# Patient Record
Sex: Male | Born: 1955 | Race: Black or African American | Hispanic: No | Marital: Married | Smoking: Former smoker
Health system: Southern US, Community
[De-identification: ages and names within clinical notes are randomized; demographics above are authoritative.]

## PROBLEM LIST (undated history)

## (undated) DIAGNOSIS — E785 Hyperlipidemia, unspecified: Secondary | ICD-10-CM

## (undated) DIAGNOSIS — I639 Cerebral infarction, unspecified: Secondary | ICD-10-CM

## (undated) DIAGNOSIS — I2510 Atherosclerotic heart disease of native coronary artery without angina pectoris: Secondary | ICD-10-CM

## (undated) DIAGNOSIS — I252 Old myocardial infarction: Secondary | ICD-10-CM

## (undated) HISTORY — DX: Hyperlipidemia, unspecified: E78.5

## (undated) HISTORY — DX: Atherosclerotic heart disease of native coronary artery without angina pectoris: I25.10

## (undated) HISTORY — DX: Cerebral infarction, unspecified: I63.9

## (undated) HISTORY — DX: Old myocardial infarction: I25.2

---

## 2006-06-25 ENCOUNTER — Encounter: Admission: RE | Admit: 2006-06-25 | Discharge: 2006-06-25 | Payer: Self-pay | Admitting: Neurosurgery

## 2017-07-28 ENCOUNTER — Encounter (HOSPITAL_BASED_OUTPATIENT_CLINIC_OR_DEPARTMENT_OTHER): Payer: Self-pay | Admitting: Emergency Medicine

## 2017-07-28 ENCOUNTER — Emergency Department (HOSPITAL_BASED_OUTPATIENT_CLINIC_OR_DEPARTMENT_OTHER)
Admission: EM | Admit: 2017-07-28 | Discharge: 2017-07-28 | Disposition: A | Discharge status: Home / Self Care | Payer: BLUE CROSS/BLUE SHIELD | Attending: Emergency Medicine | Admitting: Emergency Medicine

## 2017-07-28 DIAGNOSIS — R21 Rash and other nonspecific skin eruption: Secondary | ICD-10-CM | POA: Insufficient documentation

## 2017-07-28 DIAGNOSIS — F17200 Nicotine dependence, unspecified, uncomplicated: Secondary | ICD-10-CM | POA: Insufficient documentation

## 2017-07-28 MED ORDER — PREDNISONE 20 MG PO TABS: ORAL_TABLET | ORAL | 0 refills | Status: DC

## 2017-07-28 NOTE — ED Triage Notes (Signed)
Hives type rash since Wed, no SOB, ? laundry detergent reaction .

## 2017-07-28 NOTE — ED Provider Notes (Signed)
[facility] Provider Note   CSN: [id number] Arrival date & time: 07/28/17  0746     History   Chief Complaint Chief Complaint  Patient presents with  . Allergic Reaction    HPI Peter Dominguez is a 61 y.o. male.Complains of pruritic rash on arms and on face onset 3 days ago and nothing makes symptoms better or worse no shortness of breath no fever no pain no. He treated rash with putting alcohol on a without relief no other associated symptoms. His wife has the same symptoms, which started the same day  HPI  History reviewed. No pertinent past medical history.  There are no active problems to display for this patient.   History reviewed. No pertinent surgical history.     Home Medications    Prior to Admission medications   Medication Sig Start Date End Date Taking? Authorizing Provider  predniSONE (DELTASONE) 20 MG tablet 2 tabs po daily x 4 days 07/28/17   Doug Sou, MD    Family History No family history on file.  Social History Social History  Substance Use Topics  . Smoking status: Never Smoker  . Smokeless tobacco: Never Used  . Alcohol use Yes     Comment: social    Positive smoker occasional alcohol no illicit drug use  Allergies   Patient has no known allergies.   Review of Systems Review of Systems  Constitutional: Negative.   HENT: Negative.   Respiratory: Negative.   Cardiovascular: Negative.   Gastrointestinal: Negative.   Musculoskeletal: Negative.   Skin: Positive for rash.  Neurological: Negative.   Psychiatric/Behavioral: Negative.   All other systems reviewed and are negative.    Physical Exam Updated Vital Signs BP 129/83 (BP Location: Left Arm)   Pulse 75   Temp 98.5 F (36.9 C) (Oral)   Resp 18   Ht  (1.778 m)   Wt 86.2 kg (190 lb)   SpO2 99%   BMI 27.26 kg/m   Physical Exam  Constitutional: He appears well-developed and well-nourished. No distress.  HENT:  Head: Normocephalic and  atraumatic.  No mucosal lesions  Eyes: Pupils are equal, round, and reactive to light. Conjunctivae are normal.  Neck: Neck supple. No tracheal deviation present. No thyromegaly present.  Cardiovascular: Normal rate and regular rhythm.   No murmur heard. Pulmonary/Chest: Effort normal and breath sounds normal.  Abdominal: Soft. Bowel sounds are normal. He exhibits no distension. There is no tenderness.  Musculoskeletal: Normal range of motion. He exhibits no edema or tenderness.  Neurological: He is alert. Coordination normal.  Skin: Skin is warm and dry. Rash noted.  There are a few 1-2 mm scabbed lesions on forearms otherwise without rash  Psychiatric: He has a normal mood and affect.  Nursing note and vitals reviewed.    ED Treatments / Results  Labs (all labs ordered are listed, but only abnormal results are displayed) Labs Reviewed - No data to display  EKG  EKG Interpretation None       Radiology No results found.  Procedures Procedures (including critical care time)  Medications Ordered in ED Medications - No data to display   Initial Impression / Assessment and Plan / ED Course  I have reviewed the triage vital signs and the nursing notes.  Pertinent labs & imaging results that were available during my care of the patient were reviewed by me and considered in my medical decision making (see chart for details).     Plan prescription prednisone,  Benadryl 50 Mill grams 4 times daily as needed for itch, dermatology referral Dr. Terri Piedra as needed or he can see his primary care physician if not better by next week Rash is felt to be nonspecific Final Clinical Impressions(s) / ED Diagnoses   Final diagnoses:  Rash    New Prescriptions New Prescriptions   PREDNISONE (DELTASONE) 20 MG TABLET    2 tabs po daily x 4 days     Doug Sou, MD 07/28/17 0830

## 2017-07-28 NOTE — Discharge Instructions (Signed)
Take Benadryl 50 mg 4 times daily as needed for itch, as well as the medication prescribed contact your primary care physician or call Dr. Terri Piedra to schedule an appointment if not feeling better by next week

## 2019-05-08 ENCOUNTER — Observation Stay (HOSPITAL_COMMUNITY): Payer: BC Managed Care – PPO

## 2019-05-08 ENCOUNTER — Emergency Department (HOSPITAL_BASED_OUTPATIENT_CLINIC_OR_DEPARTMENT_OTHER): Payer: BC Managed Care – PPO

## 2019-05-08 ENCOUNTER — Observation Stay (HOSPITAL_BASED_OUTPATIENT_CLINIC_OR_DEPARTMENT_OTHER)
Admission: EM | Admit: 2019-05-08 | Discharge: 2019-05-09 | Disposition: A | Discharge status: Home / Self Care | Payer: BC Managed Care – PPO | Attending: Family Medicine | Admitting: Family Medicine

## 2019-05-08 ENCOUNTER — Other Ambulatory Visit: Payer: Self-pay

## 2019-05-08 ENCOUNTER — Encounter (HOSPITAL_BASED_OUTPATIENT_CLINIC_OR_DEPARTMENT_OTHER): Payer: Self-pay

## 2019-05-08 DIAGNOSIS — Z7982 Long term (current) use of aspirin: Secondary | ICD-10-CM | POA: Insufficient documentation

## 2019-05-08 DIAGNOSIS — Z79899 Other long term (current) drug therapy: Secondary | ICD-10-CM | POA: Insufficient documentation

## 2019-05-08 DIAGNOSIS — E785 Hyperlipidemia, unspecified: Secondary | ICD-10-CM | POA: Diagnosis not present

## 2019-05-08 DIAGNOSIS — I700 Atherosclerosis of aorta: Secondary | ICD-10-CM | POA: Insufficient documentation

## 2019-05-08 DIAGNOSIS — Z1159 Encounter for screening for other viral diseases: Secondary | ICD-10-CM | POA: Diagnosis not present

## 2019-05-08 DIAGNOSIS — I639 Cerebral infarction, unspecified: Principal | ICD-10-CM | POA: Diagnosis present

## 2019-05-08 DIAGNOSIS — R590 Localized enlarged lymph nodes: Secondary | ICD-10-CM

## 2019-05-08 DIAGNOSIS — Z7902 Long term (current) use of antithrombotics/antiplatelets: Secondary | ICD-10-CM | POA: Insufficient documentation

## 2019-05-08 DIAGNOSIS — R200 Anesthesia of skin: Secondary | ICD-10-CM

## 2019-05-08 LAB — URINALYSIS, ROUTINE W REFLEX MICROSCOPIC
Bilirubin Urine: NEGATIVE
Glucose, UA: NEGATIVE mg/dL
Ketones, ur: 15 mg/dL — AB
Leukocytes,Ua: NEGATIVE
Nitrite: NEGATIVE
Protein, ur: NEGATIVE mg/dL
Specific Gravity, Urine: >1.03 — ABNORMAL HIGH (ref 1.005–1.030)
pH: 5.5 (ref 5.0–8.0)

## 2019-05-08 LAB — CREATININE, SERUM
Creatinine, Ser: 0.85 mg/dL (ref 0.61–1.24)
GFR calc Af Amer: >60 mL/min (ref >60)
GFR calc non Af Amer: >60 mL/min (ref >60)

## 2019-05-08 LAB — CBC
HCT: 45.9 % (ref 39.0–52.0)
HCT: 42.4 % (ref 39.0–52.0)
Hemoglobin: 15.5 g/dL (ref 13.0–17.0)
Hemoglobin: 14.8 g/dL (ref 13.0–17.0)
MCH: 35.4 pg — ABNORMAL HIGH (ref 26.0–34.0)
MCH: 36 pg — ABNORMAL HIGH (ref 26.0–34.0)
MCHC: 33.8 g/dL (ref 30.0–36.0)
MCHC: 34.9 g/dL (ref 30.0–36.0)
MCV: 104.8 fL — ABNORMAL HIGH (ref 80.0–100.0)
MCV: 103.2 fL — ABNORMAL HIGH (ref 80.0–100.0)
Platelets: 213 10*3/uL (ref 150–400)
Platelets: 210 10*3/uL (ref 150–400)
RBC: 4.38 MIL/uL (ref 4.22–5.81)
RBC: 4.11 MIL/uL — ABNORMAL LOW (ref 4.22–5.81)
RDW: 12.6 % (ref 11.5–15.5)
RDW: 12.9 % (ref 11.5–15.5)
WBC: 5 10*3/uL (ref 4.0–10.5)
WBC: 4.2 10*3/uL (ref 4.0–10.5)
nRBC: 0 % (ref 0.0–0.2)
nRBC: 0 % (ref 0.0–0.2)

## 2019-05-08 LAB — COMPREHENSIVE METABOLIC PANEL
ALT: 22 U/L (ref 0–44)
AST: 29 U/L (ref 15–41)
Albumin: 3.8 g/dL (ref 3.5–5.0)
Alkaline Phosphatase: 88 U/L (ref 38–126)
Anion gap: 10 (ref 5–15)
BUN: 9 mg/dL (ref 8–23)
CO2: 20 mmol/L — ABNORMAL LOW (ref 22–32)
Calcium: 9.3 mg/dL (ref 8.9–10.3)
Chloride: 107 mmol/L (ref 98–111)
Creatinine, Ser: 0.82 mg/dL (ref 0.61–1.24)
GFR calc Af Amer: >60 mL/min (ref >60)
GFR calc non Af Amer: >60 mL/min (ref >60)
Glucose, Bld: 96 mg/dL (ref 70–99)
Potassium: 4.2 mmol/L (ref 3.5–5.1)
Sodium: 137 mmol/L (ref 135–145)
Total Bilirubin: 0.9 mg/dL (ref 0.3–1.2)
Total Protein: 7.1 g/dL (ref 6.5–8.1)

## 2019-05-08 LAB — DIFFERENTIAL
Abs Immature Granulocytes: 0.01 10*3/uL (ref 0.00–0.07)
Basophils Absolute: 0 10*3/uL (ref 0.0–0.1)
Basophils Relative: 0 %
Eosinophils Absolute: 0.1 10*3/uL (ref 0.0–0.5)
Eosinophils Relative: 2 %
Immature Granulocytes: 0 %
Lymphocytes Relative: 30 %
Lymphs Abs: 1.5 10*3/uL (ref 0.7–4.0)
Monocytes Absolute: 0.8 10*3/uL (ref 0.1–1.0)
Monocytes Relative: 16 %
Neutro Abs: 2.6 10*3/uL (ref 1.7–7.7)
Neutrophils Relative %: 52 %

## 2019-05-08 LAB — APTT: aPTT: 29 seconds (ref 24–36)

## 2019-05-08 LAB — LIPID PANEL
Cholesterol: 180 mg/dL (ref 0–200)
HDL: 28 mg/dL — ABNORMAL LOW (ref >40)
LDL Cholesterol: 127 mg/dL — ABNORMAL HIGH (ref 0–99)
Total CHOL/HDL Ratio: 6.4 RATIO
Triglycerides: 126 mg/dL (ref <150)
VLDL: 25 mg/dL (ref 0–40)

## 2019-05-08 LAB — ETHANOL: Alcohol, Ethyl (B): <10 mg/dL (ref <10)

## 2019-05-08 LAB — RAPID URINE DRUG SCREEN, HOSP PERFORMED
Amphetamines: NOT DETECTED
Barbiturates: NOT DETECTED
Benzodiazepines: NOT DETECTED
Cocaine: NOT DETECTED
Opiates: NOT DETECTED
Tetrahydrocannabinol: POSITIVE — AB

## 2019-05-08 LAB — URINALYSIS, MICROSCOPIC (REFLEX)

## 2019-05-08 LAB — PHOSPHORUS: Phosphorus: 2.6 mg/dL (ref 2.5–4.6)

## 2019-05-08 LAB — HEMOGLOBIN A1C
Hgb A1c MFr Bld: 5 % (ref 4.8–5.6)
Mean Plasma Glucose: 96.8 mg/dL

## 2019-05-08 LAB — PROTIME-INR
INR: 1.1 (ref 0.8–1.2)
Prothrombin Time: 13.7 seconds (ref 11.4–15.2)

## 2019-05-08 LAB — SARS CORONAVIRUS 2 BY RT PCR (HOSPITAL ORDER, PERFORMED IN ~~LOC~~ HOSPITAL LAB): SARS Coronavirus 2: NEGATIVE

## 2019-05-08 LAB — MAGNESIUM: Magnesium: 1.9 mg/dL (ref 1.7–2.4)

## 2019-05-08 MED ORDER — ASPIRIN 325 MG PO TABS
325.0000 mg | ORAL_TABLET | Freq: Every day | ORAL | Status: DC
  Administered 2019-05-08 – 2019-05-09 (×2): 325 mg via ORAL
  Filled 2019-05-08: qty 1

## 2019-05-08 MED ORDER — ENOXAPARIN SODIUM 40 MG/0.4ML ~~LOC~~ SOLN
40.0000 mg | SUBCUTANEOUS | Status: DC
  Administered 2019-05-08: 40 mg via SUBCUTANEOUS
  Filled 2019-05-08: qty 0.4

## 2019-05-08 MED ORDER — NICOTINE 21 MG/24HR TD PT24
21.0000 mg | MEDICATED_PATCH | Freq: Every day | TRANSDERMAL | Status: DC
  Administered 2019-05-09 (×2): 21 mg via TRANSDERMAL
  Filled 2019-05-08 (×2): qty 1

## 2019-05-08 NOTE — ED Provider Notes (Signed)
Emergency Department Provider Note   I have reviewed the triage vital signs and the nursing notes.   HISTORY  Chief Complaint Numbness   HPI Peter Dominguez is a 63 y.o. male presents to the emergency department for evaluation of acute onset right arm and leg numbness.  Symptoms began 2 days ago after waking from a nap.  He describes the numbness distribution as from his right hand up to approximately his elbow.  He also noticed numbness in his right leg from approximately his calf into his mid thigh.  He notes a history of sciatica type symptoms but is not experiencing lower back pain.  No neck pain.  No headaches.  He has not experienced any vision changes, difficulty breathing, difficulty swallowing, speech changes.  No word finding difficulties.  No prior history of TIA or stroke.  No radiation of symptoms or other modifying factors.  No past medical history on file.  Patient Active Problem List   Diagnosis Date Noted  . CVA (cerebral vascular accident) ([facility]) 05/08/2019    No past surgical history on file.  Allergies Patient has no known allergies.  No family history on file.  Social History Social History   Tobacco Use  . Smoking status: Never Smoker  . Smokeless tobacco: Never Used  Substance Use Topics  . Alcohol use: Yes    Comment: social   . Drug use: No    Review of Systems  Constitutional: No fever/chills Eyes: No visual changes. ENT: No sore throat. Cardiovascular: Denies chest pain. Respiratory: Denies shortness of breath. Gastrointestinal: No abdominal pain.  No nausea, no vomiting.  No diarrhea.  No constipation. Genitourinary: Negative for dysuria. Musculoskeletal: Negative for back pain. Skin: Negative for rash. Neurological: Negative for headaches, focal weakness. Positive right arm/leg numbness.   10-point ROS otherwise negative.  ____________________________________________   PHYSICAL EXAM:  VITAL SIGNS: Vitals:   05/08/19 1451   BP: (!) 117/93  Pulse: 70  SpO2: 99%   Constitutional: Alert and oriented. Well appearing and in no acute distress. Eyes: Conjunctivae are normal. PERRL. EOMI. Head: Atraumatic. Nose: No congestion/rhinnorhea. Mouth/Throat: Mucous membranes are moist.  Neck: No stridor.  Cardiovascular: Normal rate, regular rhythm. Good peripheral circulation. Grossly normal heart sounds.   Respiratory: Normal respiratory effort.  No retractions. Lungs CTAB. Gastrointestinal: Soft and nontender. No distention.  Musculoskeletal: No lower extremity tenderness nor edema. No gross deformities of extremities. Neurologic:  Normal speech and language. Normal strength in the bilateral upper and lower extremities. Decreased sensation to the right arm from the hand to the elbow and decreased sensation to the RLE to the right calf to the right thigh. Right foot not affected.  Skin:  Skin is warm, dry and intact. No rash noted.   ____________________________________________   LABS (all labs ordered are listed, but only abnormal results are displayed)  Labs Reviewed  CBC - Abnormal; Notable for the following components:      Result Value   MCV 104.8 (*)    MCH 35.4 (*)    All other components within normal limits  SARS CORONAVIRUS 2 (HOSPITAL ORDER, [place] [facility])  ETHANOL  DIFFERENTIAL  RAPID URINE DRUG SCREEN, HOSP PERFORMED  URINALYSIS, ROUTINE W REFLEX MICROSCOPIC  COMPREHENSIVE METABOLIC PANEL  PROTIME-INR  APTT   ____________________________________________  EKG   EKG Interpretation  Date/Time:  Thursday May 08 2019 15:01:26 EDT Ventricular Rate:  72 PR Interval:    QRS Duration: 91 QT Interval:  377 QTC Calculation: 413  R Axis:   72 Text Interpretation:  Sinus rhythm No STEMI  Confirmed by Alona BeneLong, Joshua [phone]([id number]) on 05/08/2019 3:08:38 PM       ____________________________________________  RADIOLOGY  Ct Head Wo Contrast  Result Date: 05/08/2019 CLINICAL  DATA:  Right hand numbness EXAM: CT HEAD WITHOUT CONTRAST TECHNIQUE: Contiguous axial images were obtained from the base of the skull through the vertex without intravenous contrast. COMPARISON:  None. FINDINGS: Brain: Small focal well-circumscribed low-density in the region of the left thalamus, likely remote lacunar infarct. No evidence of acute infarction, hemorrhage, hydrocephalus, extra-axial collection or mass lesion/mass effect. Vascular: No hyperdense vessel or unexpected calcification. Skull: Normal. Negative for fracture or focal lesion. Sinuses/Orbits: No acute finding. Other: None. IMPRESSION: No acute intracranial findings. Electronically Signed   By: Duanne GuessNicholas  Plundo M.D.   On: 05/08/2019 15:51    ____________________________________________   PROCEDURES  Procedure(s) performed:   Procedures  None  ____________________________________________   INITIAL IMPRESSION / ASSESSMENT AND PLAN / ED COURSE  Pertinent labs & imaging results that were available during my care of the patient were reviewed by me and considered in my medical decision making (see chart for details).   Patient presents to the emergency department for evaluation of right arm and leg weakness.  I do have concern for stroke.  Patient also with history of sciatica but should not explain right upper extremity symptoms.  Patient without neck pain or known history of cervical spine stenosis.  Plan for stroke labs, CT head without contrast, tele-neurology evaluation. VAN negative. Outside of window for intervention.   04:35 PM  CT imaging reviewed along with blood work.  Spoke with tele-neurology who evaluated the patient.  They recommend admission for work-up of possible thalamic stroke.  Discussed with the hospitalist.   Discussed patient's case with Hospitalist to request admission. Patient and family (if present) updated with plan. Care transferred to Hospitalist service.  I reviewed all nursing notes, vitals,  pertinent old records, EKGs, labs, imaging (as available).  ____________________________________________  FINAL CLINICAL IMPRESSION(S) / ED DIAGNOSES  Final diagnoses:  Right arm numbness  Right leg numbness     Note:  This document was prepared using Dragon voice recognition software and may include unintentional dictation errors.  Alona BeneJoshua Long, MD Emergency Medicine    Long, Arlyss RepressJoshua G, MD 05/08/19 1640

## 2019-05-08 NOTE — H&P (Signed)
History and Physical  Peter Dominguez IRC:[id number] DOB: March 27, 1956 DOA: 05/08/2019  Referring physician: ER provider PCP: Crista Elliot, PA-C  Outpatient Specialists:    Patient coming from: [place] emergency room  Chief Complaint: Right-sided numbness for 2 days.  HPI: Patient is a 63 year old African-American male with no significant past medical history.  Presented with 2-day history of numbness involving the right upper and lower extremity, without any motor deficit.  Numbness has persisted since onset.  Patient called the primary care provider and patient was advised to come to the hospital.  CT head done in the emergency room was negative acute infarct.  Patient was seen by telemetry neurology team and there were concerns for possible left thalamic infarct, hence, the decision to transfer patient to [facility].  No headache, no neck pain, no chest pain, no nausea or vomiting, no URI symptoms, no shortness of breath, no urinary symptoms.  Patient will be admitted for further assessment and management.  MRI of the brain is planned.  ED Course: Temperature is 98.6, blood pressure 120/84, heart rate of 80, respiratory rate of 24 and O2 sat of 100%.  CT scan is nonrevealing.  Telemetry neurology has consulted and patient and advised stroke work-up, antiplatelet (aspirin 325 mg p.o. once daily) for hospitalist team to admit patient.   Pertinent labs: Chemistry reveals sodium of 137, potassium of 4.2, chloride 107, CO2 20, BUN of 9, serum creatinine 0.82 with blood sugar of 96.  CBC reveals WBC of 5, hemoglobin 15.5, hematocrit of 45.9, MCV of 104.8, platelet count of 213.  COVID is negative.  UA reveals ketones of 15, specific gravity greater than 1.030 urine microscopy reveals few bacteria with a WBC of 0-5.  Urine toxicology was positive for tetrahydrocannabinol.  CAT scan of the head has not shown any acute intracranial findings.  EKG: Independently reviewed.    Imaging: independently reviewed.   Review of Systems:  Negative for fever, visual changes, sore throat, rash, new muscle aches, chest pain, SOB, dysuria, bleeding, n/v/abdominal pain.  No past medical history on file.  No past surgical history on file.   reports that he has never smoked. He has never used smokeless tobacco. He reports current alcohol use. He reports that he does not use drugs.  No Known Allergies  No family history on file.   Prior to Admission medications   Medication Sig Start Date End Date Taking? Authorizing Provider  predniSONE (DELTASONE) 20 MG tablet 2 tabs po daily x 4 days 07/28/17   Orlie Dakin, MD    Physical Exam: Vitals:   05/08/19 1700 05/08/19 1730 05/08/19 1800 05/08/19 2002  BP: (!) 134/104 130/86 135/89 120/84  Pulse: 65 62 63 80  Resp: 20 (!) 21 (!) 24 (!) 24  Temp:    98.6 F (37 C)  TempSrc:    Oral  SpO2: 96% 97% 96% 100%  Weight:    82.3 kg  Height:    5\' 10"  (1.778 m)    Constitutional:  . Appears calm and comfortable Eyes:  . No pallor. No jaundice.  ENMT:  . external ears, nose appear normal Neck:  . Neck is supple. No JVD Respiratory:  . CTA bilaterally, no w/r/r.  . Respiratory effort normal. No retractions or accessory muscle use Cardiovascular:  . S1S2 . No LE extremity edema   Abdomen:  . Abdomen is soft and non tender. Organs are difficult to assess. Neurologic:  . Awake and alert. . Moves all limbs.  Wt Readings from Last 3 Encounters:  05/08/19 82.3 kg  07/28/17 86.2 kg    I have personally reviewed following labs and imaging studies  Labs on Admission:  CBC: Recent Labs  Lab 05/08/19 1535  WBC 5.0  NEUTROABS 2.6  HGB 15.5  HCT 45.9  MCV 104.8*  PLT 213   Basic Metabolic Panel: Recent Labs  Lab 05/08/19 1623  NA 137  K 4.2  CL 107  CO2 20*  GLUCOSE 96  BUN 9  CREATININE 0.82  CALCIUM 9.3   Liver Function Tests: Recent Labs  Lab 05/08/19 1623  AST 29  ALT 22  ALKPHOS 88   BILITOT 0.9  PROT 7.1  ALBUMIN 3.8   No results for input(s): LIPASE, AMYLASE in the last 168 hours. No results for input(s): AMMONIA in the last 168 hours. Coagulation Profile: Recent Labs  Lab 05/08/19 1623  INR 1.1   Cardiac Enzymes: No results for input(s): CKTOTAL, CKMB, CKMBINDEX, TROPONINI in the last 168 hours. BNP (last 3 results) No results for input(s): PROBNP in the last 8760 hours. HbA1C: No results for input(s): HGBA1C in the last 72 hours. CBG: No results for input(s): GLUCAP in the last 168 hours. Lipid Profile: No results for input(s): CHOL, HDL, LDLCALC, TRIG, CHOLHDL, LDLDIRECT in the last 72 hours. Thyroid Function Tests: No results for input(s): TSH, T4TOTAL, FREET4, T3FREE, THYROIDAB in the last 72 hours. Anemia Panel: No results for input(s): VITAMINB12, FOLATE, FERRITIN, TIBC, IRON, RETICCTPCT in the last 72 hours. Urine analysis:    Component Value Date/Time   COLORURINE YELLOW 05/08/2019 1840   APPEARANCEUR CLEAR 05/08/2019 1840   LABSPEC >1.030 (H) 05/08/2019 1840   PHURINE 5.5 05/08/2019 1840   GLUCOSEU NEGATIVE 05/08/2019 1840   HGBUR TRACE (A) 05/08/2019 1840   BILIRUBINUR NEGATIVE 05/08/2019 1840   KETONESUR 15 (A) 05/08/2019 1840   PROTEINUR NEGATIVE 05/08/2019 1840   NITRITE NEGATIVE 05/08/2019 1840   LEUKOCYTESUR NEGATIVE 05/08/2019 1840   Sepsis Labs: @LABRCNTIP (procalcitonin:4,lacticidven:4) ) Recent Results (from the past 240 hour(s))  SARS Coronavirus 2 (CEPHEID - Performed [facility]), Hosp Order     Status: None   Collection Time: 05/08/19  4:15 PM   Specimen: Nasopharyngeal Swab  Result Value Ref Range Status   SARS Coronavirus 2 NEGATIVE NEGATIVE Final    Comment: (NOTE) If result is NEGATIVE SARS-CoV-2 target nucleic acids are NOT DETECTED. The SARS-CoV-2 RNA is generally detectable in upper and lower  respiratory specimens during the acute phase of infection. The lowest  concentration of SARS-CoV-2  viral copies this assay can detect is 250  copies / mL. A negative result does not preclude SARS-CoV-2 infection  and should not be used as the sole basis for treatment or other  patient management decisions.  A negative result may occur with  improper specimen collection / handling, submission of specimen other  than nasopharyngeal swab, presence of viral mutation(s) within the  areas targeted by this assay, and inadequate number of viral copies  (<250 copies / mL). A negative result must be combined with clinical  observations, patient history, and epidemiological information. If result is POSITIVE SARS-CoV-2 target nucleic acids are DETECTED. The SARS-CoV-2 RNA is generally detectable in upper and lower  respiratory specimens dur ing the acute phase of infection.  Positive  results are indicative of active infection with SARS-CoV-2.  Clinical  correlation with patient history and other diagnostic information is  necessary to determine patient infection status.  Positive results do  not rule out bacterial infection or co-infection with other viruses. If result is PRESUMPTIVE POSTIVE SARS-CoV-2 nucleic acids MAY BE PRESENT.   A presumptive positive result was obtained on the submitted specimen  and confirmed on repeat testing.  While 2019 novel coronavirus  (SARS-CoV-2) nucleic acids may be present in the submitted sample  additional confirmatory testing may be necessary for epidemiological  and / or clinical management purposes  to differentiate between  SARS-CoV-2 and other Sarbecovirus currently known to infect humans.  If clinically indicated additional testing with an alternate test  methodology [phone]([id number]) is advised. The SARS-CoV-2 RNA is generally  detectable in upper and lower respiratory sp ecimens during the acute  phase of infection. The expected result is Negative. Fact Sheet for Patients:  BoilerBrush.com.cyhttps://www.fda.gov/media/136312/download Fact Sheet for Healthcare Providers:  https://pope.com/https://www.fda.gov/media/136313/download This test is not yet approved or cleared by the [place] FDA and has been authorized for detection and/or diagnosis of SARS-CoV-2 by FDA under an Emergency Use Authorization (EUA).  This EUA will remain in effect (meaning this test can be used) for the duration of the COVID-19 declaration under Section 564(b)(1) of the Act, 21 U.S.C. section 360bbb-3(b)(1), unless the authorization is terminated or revoked sooner. Performed at [facility] [place], [address] [place], [place] [address]       Radiological Exams on Admission: Ct Head Wo Contrast  Result Date: 05/08/2019 CLINICAL DATA:  Right hand numbness EXAM: CT HEAD WITHOUT CONTRAST TECHNIQUE: Contiguous axial images were obtained from the base of the skull through the vertex without intravenous contrast. COMPARISON:  None. FINDINGS: Brain: Small focal well-circumscribed low-density in the region of the left thalamus, likely remote lacunar infarct. No evidence of acute infarction, hemorrhage, hydrocephalus, extra-axial collection or mass lesion/mass effect. Vascular: No hyperdense vessel or unexpected calcification. Skull: Normal. Negative for fracture or focal lesion. Sinuses/Orbits: No acute finding. Other: None. IMPRESSION: No acute intracranial findings. Electronically Signed   By: Duanne GuessNicholas  Plundo M.D.   On: 05/08/2019 15:51    EKG: Independently reviewed.   Active Problems:   CVA (cerebral vascular accident) ([facility])   Assessment/Plan Acute infarct, worrisome for left thalamic infarct: Place patient in observation MRI brain Echocardiogram HbA1c Urinalysis Lipid profile Continue aspirin Consider CT angios neck, but will defer to neurology. Further management will depend on hospital course.  DVT prophylaxis: Subcutaneous Lovenox Code Status: Full Family Communication:  Disposition Plan: Home eventually Consults called: Neurology is aware the patient Admission status:  Observation  Time spent: 65 minutes  Berton MountSylvester Jeral Zick, MD  [facility] Pager #: [phone] 7PM-7AM contact night coverage as above  05/08/2019, 9:34 PM

## 2019-05-08 NOTE — ED Triage Notes (Signed)
Pt reports taking a nap on Tuesday and waking up with numbness to R hand into wrist and arm. Pt also reports some numbness to his R shin into knee an thigh. Pt neuro intact. Pt also reports having issues with spine where he normally received injections but due to covid has not. Pt a/o x 4.

## 2019-05-08 NOTE — Consult Note (Signed)
[facility] Tele[facility]urology Consult Services   Date of Service:   05/08/2019 15:37:15  Impression:     .  Rule Out Acute Ischemic Stroke  Comments/Sign-Out: The patient probably has a small left thalamic stroke that needs to be worked up/ruled out. Would admit for stroke w/u. ASA 325 now. Full stroke w/u. MRI brain.  Metrics: Last Known Well: 05/06/2019 13:00:00 [facility] Notification Time: 05/08/2019 15:36:14 Arrival Time: 05/08/2019 14:45:00 Stamp Time: 05/08/2019 15:37:15 Time First Login Attempt: 05/08/2019 15:41:25 Video Start Time: 05/08/2019 15:41:25  Symptoms: right arm and leg numbness NIHSS Start Assessment Time: 05/08/2019 15:45:00 Patient is not a candidate for tPA. Patient was not deemed candidate for tPA thrombolytics because of Last Well Known Above 4.5 Hours. Video End Time: 05/08/2019 15:54:14  CT head showed no acute hemorrhage or acute core infarct. CT head was reviewed.  Clinical Presentation is not Suggestive of Large Vessel Occlusive Disease  ED Physician notified of diagnostic impression and management plan on 05/08/2019 15:57:35  Our recommendations are outlined below.  Recommendations:     .  Activate Stroke Protocol Admission/Order Set     .  Stroke/Telemetry Floor     .  Neuro Checks     .  Bedside Swallow Eval     .  DVT Prophylaxis     .  IV Fluids, Normal Saline     .  Head of Bed 30 Degrees     .  Euglycemia and Avoid Hyperthermia (PRN Acetaminophen)     .  Initiate Aspirin 325 MG Daily  Routine Consultation with Inhouse Neurology for Follow up Care  Sign Out:     .  Discussed with Emergency Department Provider    ------------------------------------------------------------------------------  History of Present Illness: Patient is a [age] year old Male.  Patient was brought by private transportation with symptoms of right arm and leg numbness  the patient was LKW last tuesday when he went to take a nap,  and woke up later with right arm and leg numbness. THis started in the hand, but progressed over time to involve arm and leg. He called his MD yesterday and she asked him to come to the ED.    Examination: 1A: Level of Consciousness - Alert; keenly responsive + 0 1B: Ask Month and Age - Both Questions Right + 0 1C: Blink Eyes & Squeeze Hands - Performs Both Tasks + 0 2: Test Horizontal Extraocular Movements - Normal + 0 3: Test Visual Fields - No Visual Loss + 0 4: Test Facial Palsy (Use Grimace if Obtunded) - Normal symmetry + 0 5A: Test Left Arm Motor Drift - No Drift for 10 Seconds + 0 5B: Test Right Arm Motor Drift - No Drift for 10 Seconds + 0 6A: Test Left Leg Motor Drift - No Drift for 5 Seconds + 0 6B: Test Right Leg Motor Drift - No Drift for 5 Seconds + 0 7: Test Limb Ataxia (FNF/Heel-Shin) - No Ataxia + 0 8: Test Sensation - Mild-Moderate Loss: Less Sharp/More Dull + 1 9: Test Language/Aphasia - Normal; No aphasia + 0 10: Test Dysarthria - Normal + 0 11: Test Extinction/Inattention - No abnormality + 0  NIHSS Score: 1   Due to the immediate potential for life-threatening deterioration due to underlying acute neurologic illness, I spent 35 minutes providing critical care. This time includes time for face to face visit via telemedicine, review of medical records, imaging studies and discussion of findings with providers, the patient and/or family.   Dr  Sallyanne Havers   [facility] [phone]  Case [id number]

## 2019-05-08 NOTE — ED Notes (Signed)
Pt reports taking medication for sleep sometimes but unable to report what med. Pt denies any med. Hx.

## 2019-05-08 NOTE — ED Notes (Signed)
Called [facility] spoke with Cassie for [facility] hospitalist

## 2019-05-08 NOTE — ED Notes (Signed)
teleneuro on screen to assess pt.

## 2019-05-08 NOTE — ED Notes (Signed)
[facility] arrived to transport pt to [place] RN called and number left for receiving nurse to call back due to shift change.

## 2019-05-09 ENCOUNTER — Observation Stay (HOSPITAL_COMMUNITY): Payer: BC Managed Care – PPO

## 2019-05-09 ENCOUNTER — Observation Stay (HOSPITAL_BASED_OUTPATIENT_CLINIC_OR_DEPARTMENT_OTHER): Payer: BC Managed Care – PPO

## 2019-05-09 DIAGNOSIS — I639 Cerebral infarction, unspecified: Secondary | ICD-10-CM | POA: Diagnosis not present

## 2019-05-09 DIAGNOSIS — E785 Hyperlipidemia, unspecified: Secondary | ICD-10-CM | POA: Diagnosis not present

## 2019-05-09 DIAGNOSIS — R590 Localized enlarged lymph nodes: Secondary | ICD-10-CM

## 2019-05-09 LAB — HIV ANTIBODY (ROUTINE TESTING W REFLEX): HIV Screen 4th Generation wRfx: NONREACTIVE

## 2019-05-09 LAB — CBC
HCT: 41.6 % (ref 39.0–52.0)
Hemoglobin: 14.6 g/dL (ref 13.0–17.0)
MCH: 36.6 pg — ABNORMAL HIGH (ref 26.0–34.0)
MCHC: 35.1 g/dL (ref 30.0–36.0)
MCV: 104.3 fL — ABNORMAL HIGH (ref 80.0–100.0)
Platelets: 202 10*3/uL (ref 150–400)
RBC: 3.99 MIL/uL — ABNORMAL LOW (ref 4.22–5.81)
RDW: 12.7 % (ref 11.5–15.5)
WBC: 4.4 10*3/uL (ref 4.0–10.5)
nRBC: 0 % (ref 0.0–0.2)

## 2019-05-09 LAB — BASIC METABOLIC PANEL
Anion gap: 9 (ref 5–15)
BUN: 7 mg/dL — ABNORMAL LOW (ref 8–23)
CO2: 20 mmol/L — ABNORMAL LOW (ref 22–32)
Calcium: 9.1 mg/dL (ref 8.9–10.3)
Chloride: 106 mmol/L (ref 98–111)
Creatinine, Ser: 0.88 mg/dL (ref 0.61–1.24)
GFR calc Af Amer: >60 mL/min (ref >60)
GFR calc non Af Amer: >60 mL/min (ref >60)
Glucose, Bld: 120 mg/dL — ABNORMAL HIGH (ref 70–99)
Potassium: 3.9 mmol/L (ref 3.5–5.1)
Sodium: 135 mmol/L (ref 135–145)

## 2019-05-09 LAB — ECHOCARDIOGRAM COMPLETE
Height: 70 in
Weight: 2903.02 oz

## 2019-05-09 LAB — TSH: TSH: 1.126 u[IU]/mL (ref 0.350–4.500)

## 2019-05-09 MED ORDER — ATORVASTATIN CALCIUM 40 MG PO TABS: 40.0000 mg | ORAL_TABLET | Freq: Every day | ORAL | Status: DC

## 2019-05-09 MED ORDER — ASPIRIN EC 81 MG PO TBEC: 81.0000 mg | DELAYED_RELEASE_TABLET | Freq: Every day | ORAL | Status: DC

## 2019-05-09 MED ORDER — CLOPIDOGREL BISULFATE 75 MG PO TABS
75.0000 mg | ORAL_TABLET | Freq: Every day | ORAL | Status: DC
  Administered 2019-05-09: 75 mg via ORAL
  Filled 2019-05-09: qty 1

## 2019-05-09 MED ORDER — ASPIRIN 81 MG PO TBEC: 81.0000 mg | DELAYED_RELEASE_TABLET | Freq: Every day | ORAL | 0 refills | Status: AC

## 2019-05-09 MED ORDER — CO-ENZYME Q-10 50 MG PO CAPS: 50.0000 mg | ORAL_CAPSULE | Freq: Every day | ORAL | 0 refills | Status: DC

## 2019-05-09 MED ORDER — CLOPIDOGREL BISULFATE 75 MG PO TABS: 75.0000 mg | ORAL_TABLET | Freq: Every day | ORAL | 0 refills | Status: AC

## 2019-05-09 MED ORDER — NICOTINE 21 MG/24HR TD PT24: 21.0000 mg | MEDICATED_PATCH | Freq: Every day | TRANSDERMAL | 0 refills | Status: DC

## 2019-05-09 MED ORDER — TRAZODONE HCL 50 MG PO TABS
50.0000 mg | ORAL_TABLET | Freq: Once | ORAL | Status: AC
  Administered 2019-05-09: 50 mg via ORAL
  Filled 2019-05-09: qty 1

## 2019-05-09 MED ORDER — STROKE: EARLY STAGES OF RECOVERY BOOK
Freq: Once | Status: AC
  Administered 2019-05-09: 07:00:00
  Filled 2019-05-09: qty 1

## 2019-05-09 MED ORDER — IOHEXOL 350 MG/ML SOLN
75.0000 mL | Freq: Once | INTRAVENOUS | Status: AC | PRN
  Administered 2019-05-09: 75 mL via INTRAVENOUS

## 2019-05-09 MED ORDER — ROSUVASTATIN CALCIUM 20 MG PO TABS: 20.0000 mg | ORAL_TABLET | Freq: Every day | ORAL | 0 refills | Status: DC

## 2019-05-09 NOTE — Progress Notes (Signed)
PT Cancellation Note  Patient Details Name: Peter Dominguez MRN: [id number] DOB: 1956-04-30   Cancelled Treatment:    Reason Eval/Treat Not Completed: PT screened, no needs identified, will sign off Per OT, pt independent with all mobility and does not need acute PT services. Will sign off. If needs change, please re-consult.   Leighton Ruff, PT, DPT  Acute Rehabilitation Services  Pager: [phone] Office: [phone]  [name] 05/09/2019, 3:44 PM

## 2019-05-09 NOTE — Discharge Summary (Addendum)
Physician Discharge Summary  Peter Dominguez JWJ:[id number] DOB: 1956-03-30 DOA: 05/08/2019  PCP: Katherine Basset, PA-C  Admit date: 05/08/2019 Discharge date: 05/09/2019  Admitted From: Home Disposition: Home  Recommendations for Outpatient Follow-up:  1. Follow up with PCP in 1 week 2. Follow up with neurology in 6 weeks 3. Please obtain BMP/CBC in one week 4. Please follow up on the following pending results: None  Home Health: None Equipment/Devices: None  Discharge Condition: Stable CODE STATUS: Full code Diet recommendation: Heart healthy   Brief/Interim Summary:  Admission HPI written by Petra Kuba, MD  HPI: Patient is a 63 year old African-American male with no significant past medical history.  Presented with 2-day history of numbness involving the right upper and lower extremity, without any motor deficit.  Numbness has persisted since onset.  Patient called the primary care provider and patient was advised to come to the hospital.  CT head done in the emergency room was negative acute infarct.  Patient was seen by telemetry neurology team and there were concerns for possible left thalamic infarct, hence, the decision to transfer patient to [facility].  No headache, no neck pain, no chest pain, no nausea or vomiting, no URI symptoms, no shortness of breath, no urinary symptoms.  Patient will be admitted for further assessment and management.  MRI of the brain is planned.  ED Course: Temperature is 98.6, blood pressure 120/84, heart rate of 80, respiratory rate of 24 and O2 sat of 100%.  CT scan is nonrevealing.  Telemetry neurology has consulted and patient and advised stroke work-up, antiplatelet (aspirin 325 mg p.o. once daily) for hospitalist team to admit patient.   Hospital course:  Ischemic left thalamic infarct Resultant right sided paresthesias. Transthoracic Echocardiogram unremarkable. Hemoglobin A1C of 5.0% and LDL of 127. Neurology  consulted. Started on aspirin and Plavix. Plavix for 21 days. Starting Crestor and Co enzyme q-10. Outpatient follow-up with neurology  Mediastinal lymphadenopathy Incidental. Will need CT chest for follow-up. Discussed with patient  Discharge Diagnoses:  Principal Problem:   Acute CVA (cerebrovascular accident) [facility]) Active Problems:   Mediastinal lymphadenopathy   Hyperlipidemia    Discharge Instructions   Allergies as of 05/09/2019      Reactions   Bupropion Swelling      Medication List    STOP taking these medications   predniSONE 20 MG tablet Commonly known as: DELTASONE     TAKE these medications   aspirin 81 MG EC tablet Take 1 tablet (81 mg total) by mouth daily. Start taking on: May 10, 2019 Notes to patient: Begin taking tomorrow, 05/10/2019   clopidogrel 75 MG tablet Commonly known as: PLAVIX Take 1 tablet (75 mg total) by mouth daily for 21 days. Start taking on: May 10, 2019 Notes to patient: Begin taking tomorrow, 05/10/2019   co-enzyme Q-10 50 MG capsule Take 1 capsule (50 mg total) by mouth daily. Notes to patient: Begin taking today 05/09/2019   nicotine 21 mg/24hr patch Commonly known as: NICODERM CQ - dosed in mg/24 hours Place 1 patch (21 mg total) onto the skin daily. Start taking on: May 10, 2019   Omega-3 1000 MG Caps Take 1,000 mg by mouth daily. Notes to patient: Begin taking today 05/09/2019   pregabalin 50 MG capsule Commonly known as: LYRICA Take 50 mg by mouth 3 (three) times daily. Notes to patient: Begin taking today 05/09/2019   rosuvastatin 20 MG tablet Commonly known as: Crestor Take 1 tablet (20 mg total) by mouth daily.  Notes to patient: Begin taking today 05/09/2019       No Known Allergies  Consultations:  Neurology   Procedures/Studies: Ct Angio Head W Or Wo Contrast  Result Date: 05/09/2019 CLINICAL DATA:  Stroke follow-up. Acute left thalamic infarct on MRI. EXAM: CT ANGIOGRAPHY HEAD AND NECK TECHNIQUE:  Multidetector CT imaging of the head and neck was performed using the standard protocol during bolus administration of intravenous contrast. Multiplanar CT image reconstructions and MIPs were obtained to evaluate the vascular anatomy. Carotid stenosis measurements (when applicable) are obtained utilizing NASCET criteria, using the distal internal carotid diameter as the denominator. CONTRAST:  75mL OMNIPAQUE IOHEXOL 350 MG/ML SOLN COMPARISON:  None. FINDINGS: CTA NECK FINDINGS Aortic arch: Normal variant aortic arch branching pattern with common origin of the brachiocephalic and left common carotid arteries. Mild aortic atherosclerosis. Widely patent arch vessel origins. Right carotid system: Patent with mild calcified and soft plaque at the carotid bifurcation. No evidence of significant stenosis or dissection. Left carotid system: Patent with minimal atherosclerotic plaque at the carotid bifurcation. No evidence of significant stenosis or dissection. Vertebral arteries: Patent and codominant without evidence of stenosis or dissection. Skeleton: Mild cervical spondylosis. Other neck: No evidence of cervical lymphadenopathy or mass. Upper chest: Multiple enlarged prevascular and AP window lymph nodes measuring up to 2.1 cm in short axis. No apical lung mass. Review of the MIP images confirms the above findings CTA HEAD FINDINGS Anterior circulation: The internal carotid arteries are patent from skull base to carotid termini with mild calcified plaque bilaterally not resulting in significant stenosis. ACAs and MCAs are patent with mild branch vessel irregularity but no evidence of proximal branch occlusion or significant proximal stenosis. No aneurysm is identified. Posterior circulation: The intracranial vertebral arteries are widely patent to the basilar. Patent PICA and SCA origins are seen bilaterally. The basilar artery is widely patent. There are left larger than right posterior communicating arteries. Both  PCAs are patent with atherosclerotic type irregularity but no evidence of flow limiting proximal stenosis. No aneurysm is identified. Venous sinuses: Patent. Anatomic variants: None. Review of the MIP images confirms the above findings IMPRESSION: 1. Mild atherosclerosis in the head and neck without evidence of large vessel occlusion or significant proximal stenosis. 2. Mediastinal lymphadenopathy of indeterminate etiology. A chest CT is recommended for further evaluation. 3.  Aortic Atherosclerosis (ICD10-I70.0). Electronically Signed   By: Sebastian AcheAllen  Grady M.D.   On: 05/09/2019 16:43   Ct Head Wo Contrast  Result Date: 05/08/2019 CLINICAL DATA:  Right hand numbness EXAM: CT HEAD WITHOUT CONTRAST TECHNIQUE: Contiguous axial images were obtained from the base of the skull through the vertex without intravenous contrast. COMPARISON:  None. FINDINGS: Brain: Small focal well-circumscribed low-density in the region of the left thalamus, likely remote lacunar infarct. No evidence of acute infarction, hemorrhage, hydrocephalus, extra-axial collection or mass lesion/mass effect. Vascular: No hyperdense vessel or unexpected calcification. Skull: Normal. Negative for fracture or focal lesion. Sinuses/Orbits: No acute finding. Other: None. IMPRESSION: No acute intracranial findings. Electronically Signed   By: Duanne GuessNicholas  Plundo M.D.   On: 05/08/2019 15:51   Ct Angio Neck W Or Wo Contrast  Result Date: 05/09/2019 CLINICAL DATA:  Stroke follow-up. Acute left thalamic infarct on MRI. EXAM: CT ANGIOGRAPHY HEAD AND NECK TECHNIQUE: Multidetector CT imaging of the head and neck was performed using the standard protocol during bolus administration of intravenous contrast. Multiplanar CT image reconstructions and MIPs were obtained to evaluate the vascular anatomy. Carotid stenosis measurements (when applicable) are obtained  utilizing NASCET criteria, using the distal internal carotid diameter as the denominator. CONTRAST:  75mL  OMNIPAQUE IOHEXOL 350 MG/ML SOLN COMPARISON:  None. FINDINGS: CTA NECK FINDINGS Aortic arch: Normal variant aortic arch branching pattern with common origin of the brachiocephalic and left common carotid arteries. Mild aortic atherosclerosis. Widely patent arch vessel origins. Right carotid system: Patent with mild calcified and soft plaque at the carotid bifurcation. No evidence of significant stenosis or dissection. Left carotid system: Patent with minimal atherosclerotic plaque at the carotid bifurcation. No evidence of significant stenosis or dissection. Vertebral arteries: Patent and codominant without evidence of stenosis or dissection. Skeleton: Mild cervical spondylosis. Other neck: No evidence of cervical lymphadenopathy or mass. Upper chest: Multiple enlarged prevascular and AP window lymph nodes measuring up to 2.1 cm in short axis. No apical lung mass. Review of the MIP images confirms the above findings CTA HEAD FINDINGS Anterior circulation: The internal carotid arteries are patent from skull base to carotid termini with mild calcified plaque bilaterally not resulting in significant stenosis. ACAs and MCAs are patent with mild branch vessel irregularity but no evidence of proximal branch occlusion or significant proximal stenosis. No aneurysm is identified. Posterior circulation: The intracranial vertebral arteries are widely patent to the basilar. Patent PICA and SCA origins are seen bilaterally. The basilar artery is widely patent. There are left larger than right posterior communicating arteries. Both PCAs are patent with atherosclerotic type irregularity but no evidence of flow limiting proximal stenosis. No aneurysm is identified. Venous sinuses: Patent. Anatomic variants: None. Review of the MIP images confirms the above findings IMPRESSION: 1. Mild atherosclerosis in the head and neck without evidence of large vessel occlusion or significant proximal stenosis. 2. Mediastinal lymphadenopathy of  indeterminate etiology. A chest CT is recommended for further evaluation. 3.  Aortic Atherosclerosis (ICD10-I70.0). Electronically Signed   By: Sebastian AcheAllen  Grady M.D.   On: 05/09/2019 16:43   Mr Brain Wo Contrast  Result Date: 05/09/2019 CLINICAL DATA:  Stroke follow-up. Acute onset right arm and leg numbness. EXAM: MRI HEAD WITHOUT CONTRAST TECHNIQUE: Multiplanar, multiecho pulse sequences of the brain and surrounding structures were obtained without intravenous contrast. COMPARISON:  None. FINDINGS: BRAIN: There is a small focus of abnormal diffusion restriction within the left thalamus. This is near the posterior limb of the left internal capsule. Multifocal white matter hyperintensity, most commonly due to chronic ischemic microangiopathy. The cerebral and cerebellar volume are age-appropriate. There is no hydrocephalus. The midline structures are normal. There is no midline shift or mass effect. Susceptibility-sensitive sequences show no chronic microhemorrhage or superficial siderosis. VASCULAR: The major intracranial arterial and venous sinus flow voids are normal. SKULL AND UPPER CERVICAL SPINE: Calvarial bone marrow signal is normal. There is no skull base mass. The visualized upper cervical spine and soft tissues are normal. SINUSES/ORBITS: There are no fluid levels or advanced mucosal thickening. The mastoid air cells and middle ear cavities are free of fluid. The orbits are normal. IMPRESSION: 1. Small acute infarct within the left thalamus, near the posterior limb of the internal capsule, in keeping with reported right-sided numbness. 2. Mild chronic ischemic microangiopathy. Electronically Signed   By: Deatra RobinsonKevin  Herman M.D.   On: 05/09/2019 00:07     Transthoracic Echocardiogram (05/10/2019) IMPRESSIONS    1. The left ventricle has hyperdynamic systolic function, with an ejection fraction of >65%. The cavity size was normal. Left ventricular diastolic Doppler parameters are consistent with  impaired relaxation. No evidence of left ventricular regional wall  motion abnormalities.  2. The right ventricle has normal systolic function. The cavity was normal. There is no increase in right ventricular wall thickness. Right ventricular systolic pressure could not be assessed.  3. The aortic valve is tricuspid. Mild sclerosis of the aortic valve.  4. The aorta is normal in size and structure.   Subjective: No issues today. Numbness mildly improved.  Discharge Exam: Vitals:   05/09/19 0338 05/09/19 0700  BP: (!) 112/91 113/85  Pulse: 71 77  Resp: 18 18  Temp: (!) 97.5 F (36.4 C) 98.6 F (37 C)  SpO2: 98% 100%   Vitals:   05/08/19 2200 05/08/19 2309 05/09/19 0338 05/09/19 0700  BP: 126/88 (!) 130/99 (!) 112/91 113/85  Pulse: 66 62 71 77  Resp: Temp: 98.4 F (36.9 C) 98.3 F (36.8 C) (!) 97.5 F (36.4 C) 98.6 F (37 C)  TempSrc: Oral Oral Oral Axillary  SpO2: 98% 99% 98% 100%  Weight:      Height:        General: Pt is alert, awake, not in acute distress Cardiovascular: RRR, S1/S2 +, no rubs, no gallops Respiratory: CTA bilaterally, no wheezing, no rhonchi Abdominal: Soft, NT, ND, bowel sounds + Extremities: no edema, no cyanosis    The results of significant diagnostics from this hospitalization (including imaging, microbiology, ancillary and laboratory) are listed below for reference.     Microbiology: Recent Results (from the past 240 hour(s))  SARS Coronavirus 2 (CEPHEID - Performed in [facility] hospital lab), Hosp Order     Status: None   Collection Time: 05/08/19  4:15 PM   Specimen: Nasopharyngeal Swab  Result Value Ref Range Status   SARS Coronavirus 2 NEGATIVE NEGATIVE Final    Comment: (NOTE) If result is NEGATIVE SARS-CoV-2 target nucleic acids are NOT DETECTED. The SARS-CoV-2 RNA is generally detectable in upper and lower  respiratory specimens during the acute phase of infection. The lowest  concentration of SARS-CoV-2 viral  copies this assay can detect is 250  copies / mL. A negative result does not preclude SARS-CoV-2 infection  and should not be used as the sole basis for treatment or other  patient management decisions.  A negative result may occur with  improper specimen collection / handling, submission of specimen other  than nasopharyngeal swab, presence of viral mutation(s) within the  areas targeted by this assay, and inadequate number of viral copies  (<250 copies / mL). A negative result must be combined with clinical  observations, patient history, and epidemiological information. If result is POSITIVE SARS-CoV-2 target nucleic acids are DETECTED. The SARS-CoV-2 RNA is generally detectable in upper and lower  respiratory specimens dur ing the acute phase of infection.  Positive  results are indicative of active infection with SARS-CoV-2.  Clinical  correlation with patient history and other diagnostic information is  necessary to determine patient infection status.  Positive results do  not rule out bacterial infection or co-infection with other viruses. If result is PRESUMPTIVE POSTIVE SARS-CoV-2 nucleic acids MAY BE PRESENT.   A presumptive positive result was obtained on the submitted specimen  and confirmed on repeat testing.  While 2019 novel coronavirus  (SARS-CoV-2) nucleic acids may be present in the submitted sample  additional confirmatory testing may be necessary for epidemiological  and / or clinical management purposes  to differentiate between  SARS-CoV-2 and other Sarbecovirus currently known to infect humans.  If clinically indicated additional testing with an alternate test  methodology [phone]) is advised. The  SARS-CoV-2 RNA is generally  detectable in upper and lower respiratory sp ecimens during the acute  phase of infection. The expected result is Negative. Fact Sheet for Patients:  StrictlyIdeas.no Fact Sheet for Healthcare  Providers: BankingDealers.co.za This test is not yet approved or cleared by the [place] FDA and has been authorized for detection and/or diagnosis of SARS-CoV-2 by FDA under an Emergency Use Authorization (EUA).  This EUA will remain in effect (meaning this test can be used) for the duration of the COVID-19 declaration under Section 564(b)(1) of the Act, 21 U.S.C. section 360bbb-3(b)(1), unless the authorization is terminated or revoked sooner. Performed at [facility], [address] [place], [place] [address]      Labs: BNP (last 3 results) No results for input(s): BNP in the last 8760 hours. Basic Metabolic Panel: Recent Labs  Lab 05/08/19 1623 05/08/19 2232 05/09/19 0609  NA 137  --  135  K 4.2  --  3.9  CL 107  --  106  CO2 20*  --  20*  GLUCOSE 96  --  120*  BUN 9  --  7*  CREATININE 0.82 0.85 0.88  CALCIUM 9.3  --  9.1  MG  --  1.9  --   PHOS  --  2.6  --    Liver Function Tests: Recent Labs  Lab 05/08/19 1623  AST 29  ALT 22  ALKPHOS 88  BILITOT 0.9  PROT 7.1  ALBUMIN 3.8   No results for input(s): LIPASE, AMYLASE in the last 168 hours. No results for input(s): AMMONIA in the last 168 hours. CBC: Recent Labs  Lab 05/08/19 1535 05/08/19 2232 05/09/19 0609  WBC 5.0 4.2 4.4  NEUTROABS 2.6  --   --   HGB 15.5 14.8 14.6  HCT 45.9 42.4 41.6  MCV 104.8* 103.2* 104.3*  PLT 213 210 202   Cardiac Enzymes: No results for input(s): CKTOTAL, CKMB, CKMBINDEX, TROPONINI in the last 168 hours. BNP: Invalid input(s): POCBNP CBG: No results for input(s): GLUCAP in the last 168 hours. D-Dimer No results for input(s): DDIMER in the last 72 hours. Hgb A1c Recent Labs    05/08/19 2232  HGBA1C 5.0   Lipid Profile Recent Labs    05/08/19 2232  CHOL 180  HDL 28*  LDLCALC 127*  TRIG 126  CHOLHDL 6.4   Thyroid function studies Recent Labs    05/08/19 2232  TSH 1.126   Anemia work up No results for  input(s): VITAMINB12, FOLATE, FERRITIN, TIBC, IRON, RETICCTPCT in the last 72 hours. Urinalysis    Component Value Date/Time   COLORURINE YELLOW 05/08/2019 1840   APPEARANCEUR CLEAR 05/08/2019 1840   LABSPEC >1.030 (H) 05/08/2019 1840   PHURINE 5.5 05/08/2019 1840   GLUCOSEU NEGATIVE 05/08/2019 [place] (A) 05/08/2019 1840   BILIRUBINUR NEGATIVE 05/08/2019 1840   KETONESUR 15 (A) 05/08/2019 1840   PROTEINUR NEGATIVE 05/08/2019 1840   NITRITE NEGATIVE 05/08/2019 1840   LEUKOCYTESUR NEGATIVE 05/08/2019 1840   Sepsis Labs Invalid input(s): PROCALCITONIN,  WBC,  LACTICIDVEN Microbiology Recent Results (from the past 240 hour(s))  SARS Coronavirus 2 (CEPHEID - Performed in [place] hospital lab), Hosp Order     Status: None   Collection Time: 05/08/19  4:15 PM   Specimen: Nasopharyngeal Swab  Result Value Ref Range Status   SARS Coronavirus 2 NEGATIVE NEGATIVE Final    Comment: (NOTE) If result is NEGATIVE SARS-CoV-2 target nucleic acids are NOT DETECTED. The SARS-CoV-2  RNA is generally detectable in upper and lower  respiratory specimens during the acute phase of infection. The lowest  concentration of SARS-CoV-2 viral copies this assay can detect is 250  copies / mL. A negative result does not preclude SARS-CoV-2 infection  and should not be used as the sole basis for treatment or other  patient management decisions.  A negative result may occur with  improper specimen collection / handling, submission of specimen other  than nasopharyngeal swab, presence of viral mutation(s) within the  areas targeted by this assay, and inadequate number of viral copies  (<250 copies / mL). A negative result must be combined with clinical  observations, patient history, and epidemiological information. If result is POSITIVE SARS-CoV-2 target nucleic acids are DETECTED. The SARS-CoV-2 RNA is generally detectable in upper and lower  respiratory specimens dur ing the acute phase of  infection.  Positive  results are indicative of active infection with SARS-CoV-2.  Clinical  correlation with patient history and other diagnostic information is  necessary to determine patient infection status.  Positive results do  not rule out bacterial infection or co-infection with other viruses. If result is PRESUMPTIVE POSTIVE SARS-CoV-2 nucleic acids MAY BE PRESENT.   A presumptive positive result was obtained on the submitted specimen  and confirmed on repeat testing.  While 2019 novel coronavirus  (SARS-CoV-2) nucleic acids may be present in the submitted sample  additional confirmatory testing may be necessary for epidemiological  and / or clinical management purposes  to differentiate between  SARS-CoV-2 and other Sarbecovirus currently known to infect humans.  If clinically indicated additional testing with an alternate test  methodology [phone]([id number]) is advised. The SARS-CoV-2 RNA is generally  detectable in upper and lower respiratory sp ecimens during the acute  phase of infection. The expected result is Negative. Fact Sheet for Patients:  BoilerBrush.com.cyhttps://www.fda.gov/media/136312/download Fact Sheet for Healthcare Providers: https://pope.com/https://www.fda.gov/media/136313/download This test is not yet approved or cleared by the [place] FDA and has been authorized for detection and/or diagnosis of SARS-CoV-2 by FDA under an Emergency Use Authorization (EUA).  This EUA will remain in effect (meaning this test can be used) for the duration of the COVID-19 declaration under Section 564(b)(1) of the Act, 21 U.S.C. section 360bbb-3(b)(1), unless the authorization is terminated or revoked sooner. Performed at [facility] [place], [address] [place], [place] [id number]     SIGNED:   Jacquelin Hawkingalph Grasiela Jonsson, MD [facility] 05/09/2019, 9:10 AM

## 2019-05-09 NOTE — Progress Notes (Signed)
Pt given discharge instructions and teaching. Able to verbalize understanding and teach back. No new questions or concerns. IV and tele removed. Wife to transport home

## 2019-05-09 NOTE — Consult Note (Addendum)
Referring Physician: Dr Dartha Lodgegbata    Reason for Consult: Neurologic opinion for stroke  HPI: Peter Dominguez is an 63 y.o. male who has no documented PMH arrives to outside Emergency department for 2 days of left arm/leg numbness. Pt states that on Tuesday afternoon he awoke from a nap around 1300 and thought he had slept funny on his right arm as it felt numb, but when he went to stand he also noted the same numbness in right leg. He has a long history of sciatica, so he figured these things just needed time to go away. Finally after two days he called his PCP who directed him to the ER for stroke evaluation. He was seen by telespecialist. NIHSS 1. CTH showed no acute changes. MRI shows a small left thalamic stroke.   Date last known well: 11/05/18 Time last known well: 1300  tPA Given: outside any time windows. No LVO.  Past Medical History No past medical history on file.  Surgical History No past surgical history on file.  Family History  No family history on file.  Social History:   reports that he has never smoked. He has never used smokeless tobacco. He reports current alcohol use. He reports that he does not use drugs.  Allergies:  Allergies  Allergen Reactions  . Bupropion Swelling    Home Medications:  Medications Prior to Admission  Medication Sig Dispense Refill  . Omega-3 1000 MG CAPS Take 1,000 mg by mouth daily.    . pregabalin (LYRICA) 50 MG capsule Take 50 mg by mouth 3 (three) times daily.    . predniSONE (DELTASONE) 20 MG tablet 2 tabs po daily x 4 days (Patient not taking: Reported on 05/09/2019) 8 tablet 0    Hospital Medications . aspirin  325 mg Oral Daily  . enoxaparin (LOVENOX) injection  40 mg Subcutaneous Q24H  . nicotine  21 mg Transdermal Daily    ROS:  History obtained from pt  General ROS: negative for - chills, fatigue, fever, night sweats, weight gain or weight loss Psychological ROS: negative for - behavioral disorder, hallucinations, memory  difficulties, mood swings or suicidal ideation Ophthalmic ROS: negative for - blurry vision, double vision, eye pain or loss of vision ENT ROS: negative for - epistaxis, nasal discharge, oral lesions, sore throat, tinnitus or vertigo Allergy and Immunology ROS: negative for - hives or itchy/watery eyes Hematological and Lymphatic ROS: negative for - bleeding problems, bruising or swollen lymph nodes Endocrine ROS: negative for - galactorrhea, hair pattern changes, polydipsia/polyuria or temperature intolerance Respiratory ROS: negative for - cough, hemoptysis, shortness of breath or wheezing Cardiovascular ROS: negative for - chest pain, dyspnea on exertion, edema or irregular heartbeat Gastrointestinal ROS: negative for - abdominal pain, diarrhea, hematemesis, nausea/vomiting or stool incontinence Genito-Urinary ROS: negative for - dysuria, hematuria, incontinence or urinary frequency/urgency Musculoskeletal ROS: negative for - joint swelling or muscular weakness Neurological ROS: as noted in HPI Dermatological ROS: negative for rash and skin lesion changes   Physical Examination:  Vitals:   05/08/19 2309 05/09/19 0338 05/09/19 0700 05/09/19 1100  BP: (!) 130/99 (!) 112/91 113/85 116/79  Pulse: 62 71 77 75  Resp: 18 18 18 18   Temp: 98.3 F (36.8 C) (!) 97.5 F (36.4 C) 98.6 F (37 C) 98.9 F (37.2 C)  TempSrc: Oral Oral Axillary Axillary  SpO2: 99% 98% 100% 99%  Weight:      Height:        General - no acute distress Heart - Regular  rate and rhythm - no murmer appreciated Lungs - Clear to auscultation  Abdomen - Soft - non tender Extremities - Distal pulses intact - no edema Skin - Warm and dry   Neurologic Examination:  Mental Status: Alert, oriented, thought content appropriate.  Speech fluent without evidence of aphasia. Able to follow 3 step commands without difficulty. Cranial Nerves: II: Discs not visualized; Visual fields grossly normal, pupils equal, round,  reactive to light III,IV, VI: ptosis not present, extra-ocular motions intact bilaterally V,VII: smile symmetric, facial light touch sensation normal bilaterally VIII: hearing normal bilaterally IX,X: gag reflex present XI: bilateral shoulder shrug XII: midline tongue extension Motor: RUE - 5/5    LUE - 5/5   RLE - 5/5    LLE - 5/5 Tone and bulk:normal tone throughout; no atrophy noted Sensory: Light touch is dull on right leg, arm to ~shoulder. Traps and iface spared. left is wnl.  Deep Tendon Reflexes: 2+ and symmetric throughout Plantars: Right: downgoing   Left: downgoing Cerebellar: normal finger-to-nose and normal heel-to-shin test Gait: deferred at this time.   NIHSS 1a Level of Conscious: 1b LOC Questions:  1c LOC Commands:  2 Best Gaze:  3 Visual:  4 Facial Palsy:  5a Motor Arm - left:  5b Motor Arm - Right:  6a Motor Leg - Left:  6b Motor Leg - Right:  7 Limb Ataxia:  8 Sensory: 1 9 Best Language:  10 Dysarthria: 11 Extinct. and Inattention: TOTAL: 1   LABORATORY STUDIES:  Basic Metabolic Panel: Recent Labs  Lab 05/08/19 1623 05/08/19 2232 05/09/19 0609  NA 137  --  135  K 4.2  --  3.9  CL 107  --  106  CO2 20*  --  20*  GLUCOSE 96  --  120*  BUN 9  --  7*  CREATININE 0.82 0.85 0.88  CALCIUM 9.3  --  9.1  MG  --  1.9  --   PHOS  --  2.6  --     Liver Function Tests: Recent Labs  Lab 05/08/19 1623  AST 29  ALT 22  ALKPHOS 88  BILITOT 0.9  PROT 7.1  ALBUMIN 3.8   No results for input(s): LIPASE, AMYLASE in the last 168 hours. No results for input(s): AMMONIA in the last 168 hours.  CBC: Recent Labs  Lab 05/08/19 1535 05/08/19 2232 05/09/19 0609  WBC 5.0 4.2 4.4  NEUTROABS 2.6  --   --   HGB 15.5 14.8 14.6  HCT 45.9 42.4 41.6  MCV 104.8* 103.2* 104.3*  PLT 213 210 202    Cardiac Enzymes: No results for input(s): CKTOTAL, CKMB, CKMBINDEX, TROPONINI in the last 168 hours.  BNP: Invalid input(s): POCBNP  CBG: No results  for input(s): GLUCAP in the last 168 hours.  Microbiology:   Coagulation Studies: Recent Labs    05/08/19 1623  LABPROT 13.7  INR 1.1    Urinalysis:  Recent Labs  Lab 05/08/19 1840  COLORURINE YELLOW  LABSPEC >1.030*  PHURINE 5.5  GLUCOSEU NEGATIVE  HGBUR TRACE*  BILIRUBINUR NEGATIVE  KETONESUR 15*  PROTEINUR NEGATIVE  NITRITE NEGATIVE  LEUKOCYTESUR NEGATIVE    Lipid Panel:     Component Value Date/Time   CHOL 180 05/08/2019 2232   TRIG 126 05/08/2019 2232   HDL 28 (L) 05/08/2019 2232   CHOLHDL 6.4 05/08/2019 2232   VLDL 25 05/08/2019 2232   LDLCALC 127 (H) 05/08/2019 2232    HgbA1C:  Lab Results  Component Value Date  HGBA1C 5.0 05/08/2019    Urine Drug Screen:      Component Value Date/Time   LABOPIA NONE DETECTED 05/08/2019 1840   COCAINSCRNUR NONE DETECTED 05/08/2019 1840   LABBENZ NONE DETECTED 05/08/2019 1840   AMPHETMU NONE DETECTED 05/08/2019 1840   THCU POSITIVE (A) 05/08/2019 1840   LABBARB NONE DETECTED 05/08/2019 1840     Alcohol Level:  Recent Labs  Lab 05/08/19 1535  ETH <10    Miscellaneous labs:  EKG  EKG   IMAGING: Ct Head Wo Contrast  Result Date: 05/08/2019 CLINICAL DATA:  Right hand numbness EXAM: CT HEAD WITHOUT CONTRAST TECHNIQUE: Contiguous axial images were obtained from the base of the skull through the vertex without intravenous contrast. COMPARISON:  None. FINDINGS: Brain: Small focal well-circumscribed low-density in the region of the left thalamus, likely remote lacunar infarct. No evidence of acute infarction, hemorrhage, hydrocephalus, extra-axial collection or mass lesion/mass effect. Vascular: No hyperdense vessel or unexpected calcification. Skull: Normal. Negative for fracture or focal lesion. Sinuses/Orbits: No acute finding. Other: None. IMPRESSION: No acute intracranial findings. Electronically Signed   By: Duanne GuessNicholas  Plundo M.D.   On: 05/08/2019 15:51   Mr Brain Wo Contrast  Result Date:  05/09/2019 CLINICAL DATA:  Stroke follow-up. Acute onset right arm and leg numbness. EXAM: MRI HEAD WITHOUT CONTRAST TECHNIQUE: Multiplanar, multiecho pulse sequences of the brain and surrounding structures were obtained without intravenous contrast. COMPARISON:  None. FINDINGS: BRAIN: There is a small focus of abnormal diffusion restriction within the left thalamus. This is near the posterior limb of the left internal capsule. Multifocal white matter hyperintensity, most commonly due to chronic ischemic microangiopathy. The cerebral and cerebellar volume are age-appropriate. There is no hydrocephalus. The midline structures are normal. There is no midline shift or mass effect. Susceptibility-sensitive sequences show no chronic microhemorrhage or superficial siderosis. VASCULAR: The major intracranial arterial and venous sinus flow voids are normal. SKULL AND UPPER CERVICAL SPINE: Calvarial bone marrow signal is normal. There is no skull base mass. The visualized upper cervical spine and soft tissues are normal. SINUSES/ORBITS: There are no fluid levels or advanced mucosal thickening. The mastoid air cells and middle ear cavities are free of fluid. The orbits are normal. IMPRESSION: 1. Small acute infarct within the left thalamus, near the posterior limb of the internal capsule, in keeping with reported right-sided numbness. 2. Mild chronic ischemic microangiopathy. Electronically Signed   By: Deatra RobinsonKevin  Herman M.D.   On: 05/09/2019 00:07    Transthoracic Echocardiogram - 65% EF, no thrombus  Assessment: 63 y.o. male who presented at outside hospital with right side numbness x2days. MRI showed left thalamic infarct.   . Small vessel etiology Ischemic stroke- stroke risks are smoking, age, HLD. . Right side numbness- d/t above. OT/PT evals  Hyperlipidemia  Lipid lowering medication PTA:  none  LDL 127, goal < 70  Current lipid lowering medication:lipitor 40mg   Continue statin at discharge  Other  Stroke Risk Factors  Current Cigarette smoker; I have advised to stop smoking   Plan:  CT Angiogram Head and Neck to complete vascular wk up  PT consult, OT consult  DAPT: asa 81mg   + Plavix 75mg  for 3 weeks, then ASA only   No role in permissive hypertension for subacute stroke  Start Crestor 20mg , CoQ10 supplement ( due to Lipitor intolerance)  Risk factor modification: smoking cessation  Telemetry monitoring  Frequent neuro checks  Ok to d/c after CTA is completed and reviewed by neuro     Attending Neurologist's  note to follow Desiree Metzger-Cihelka, ARNP-C, ANVP-BC Pager: [phone]   NEUROHOSPITALIST ADDENDUM Performed a face to face diagnostic evaluation.   I have reviewed the contents of history and physical exam as documented by PA/ARNP/Resident and agree with above documentation.  I have discussed and formulated the above plan as documented. Edits to the note have been made as needed.  Mr. Settlemire is a very pleasant 63 year old male with past medical history of tobacco abuse, hyperlipidemia not on statin due to intolerance for muscle cramps presents with right-sided numbness, MRI shows a small left thalamic infarct likely small vessel disease.  Echocardiogram within normal limits.  LDL elevated 127. CTA ordered and is pending.  Left thalamic stroke likely due to small vessel disease from smoking, hyperlipidemia.  Recommend dual antiplatelets for 3 weeks and then aspirin alone.  Recommend Crestor as patient has tried Lipitor in the past and developed muscle cramps.  Supplement with co-Q10.  Outpatient stroke follow-up with Dr. Leonie Man.  Patient can be discharged if CT angiogram is negative for significant extracranial or intracranial stenosis.    Karena Addison Antonia Jicha MD [facility] [phone]   If 7pm to 7am, please call on call as listed on AMION.

## 2019-05-09 NOTE — Progress Notes (Signed)
  Echocardiogram 2D Echocardiogram has been performed.  Bobbye Charleston 05/09/2019, 9:20 AM

## 2019-05-09 NOTE — Discharge Instructions (Signed)
Peter Dominguez,  You were in the hospital with a stroke. Your medications have been adjusted. Please follow-up with the neurologist in 6 weeks.

## 2019-05-09 NOTE — Progress Notes (Signed)
Occupational Therapy Evaluation Patient Details Name: Peter Dominguez MRN: [id number] DOB: [birth year] Today's Date: 05/09/2019    History of Present Illness Pt presented to ED with right side numbness (experiencing for 2 days). CTH showed no acute changes. MRI shows a small left thalamic stroke.    Clinical Impression   Pt presenting with right UE and right LE numbness and tingling. Gross and fine motor coordination is WNL. While sensation is slightly impaired, he demonstrates 100% accuracy with detection of light touch on individual digits. Pt lives at home with wife and is independent at baseline. Pt continues to demonstrate independence with ADLs and mobility. Able to ambulate around unit independently without any LOB. Pt educated on signs and symptoms of stroke and encouraged to continue reading stroke education material at his bedside. Pt has not experienced any decline in function and continues to be independent. Education completed. Will sign off.     Follow Up Recommendations  No OT follow up    Equipment Recommendations  None recommended by OT    Recommendations for Other Services       Precautions / Restrictions Precautions Precautions: None Restrictions Weight Bearing Restrictions: No      Mobility Bed Mobility Overal bed mobility: Independent                Transfers Overall transfer level: Independent                    Balance                                           ADL either performed or assessed with clinical judgement   ADL Overall ADL's : Independent                                       General ADL Comments: Pt able to independently perform UB/LB dressing tasks.  Able to open containers using right hand. Independent with functional mobility to ambulate around unit, no LOB with head turns.      Vision Patient Visual Report: No change from baseline Vision Assessment?: No apparent visual deficits      Perception     Praxis      Pertinent Vitals/Pain Pain Assessment: No/denies pain     Hand Dominance Right   Extremity/Trunk Assessment Upper Extremity Assessment Upper Extremity Assessment: Overall WFL for tasks assessed(right UE numbness/tingling from hand to mid humerus)   Lower Extremity Assessment Lower Extremity Assessment: Overall WFL for tasks assessed(right LE numbness/tingling)       Communication Communication Communication: No difficulties   Cognition Arousal/Alertness: Awake/alert Behavior During Therapy: WFL for tasks assessed/performed Overall Cognitive Status: Within Functional Limits for tasks assessed                                     General Comments  Educated pt on signs and symptoms of stroke (BE FAST). Pt already reviewing stroke handout material. Therapist recommended he also have wife review education matieral.    Exercises     Shoulder Instructions      Home Living Family/patient expects to be discharged to:: Private residence Living Arrangements: Spouse/significant other Available Help at Discharge: Family;Available PRN/intermittently Type of Home: [place]  Access: Stairs to enter Entergy CorporationEntrance Stairs-Number of Steps: 1   Home Layout: One level     Bathroom Shower/Tub: Producer, television/film/videoWalk-in shower   Bathroom Toilet: Handicapped height     Home Equipment: Environmental consultantWalker - 2 wheels;Cane - single point          Prior Functioning/Environment Level of Independence: Independent                 OT Problem List: Impaired sensation      OT Treatment/Interventions:      OT Goals(Current goals can be found in the care plan section) Acute Rehab OT Goals Patient Stated Goal: go home  OT Frequency:     Barriers to D/C:            Co-evaluation              AM-PAC OT "6 Clicks" Daily Activity     Outcome Measure Help from another person eating meals?: None Help from another person taking care of personal grooming?:  None Help from another person toileting, which includes using toliet, bedpan, or urinal?: None Help from another person bathing (including washing, rinsing, drying)?: None Help from another person to put on and taking off regular upper body clothing?: None Help from another person to put on and taking off regular lower body clothing?: None 6 Click Score: 24   End of Session Nurse Communication: Mobility status(OT signing off)  Activity Tolerance: Patient tolerated treatment well Patient left: in bed;with call bell/phone within reach  OT Visit Diagnosis: Other symptoms and signs involving the nervous system (R29.898)                Time: [id number] OT Time Calculation (min): 21 min Charges:  OT General Charges $OT Visit: 1 Visit OT Evaluation $OT Eval Low Complexity: 1 Low    Cipriano MileJohnson,  Elizabeth OTR/L Acute Rehabilitation Services [phone] 05/09/2019, 3:40 PM

## 2019-05-23 ENCOUNTER — Ambulatory Visit: Payer: Self-pay | Admitting: *Deleted

## 2019-05-23 NOTE — Telephone Encounter (Signed)
Residual effects for stroke- numbness in  Right arm and leg. Patient reports he has intensified numbness with movement. Patient reports it has been 2 weeks since. Patient has seen PCP and advised to take tylenol until he sees neurologist.Patient has appointment with neurologist 9/5.  Patient states he does not have changes in his condition- he just isn't getting any better- or having improvement. Patient is ambulatory and has no problem with movement- it does interfere with his sleep at times. Advised patient to call neurologist office and see if they can possibly move his appointment up- or put him on cancellation list. Advised him it may take longer than 2 weeks for him to have improvement in the numbness sensations he is getting. Patient to follow up with PCP for any new symptoms or changes.  Reason for Disposition . [1] Tingling (e.g., pins and needles) of the face, arm / hand, or leg / foot on one side of the body AND [2] present now  Answer Assessment - Initial Assessment Questions 1. SYMPTOM: "What is the main symptom you are concerned about?" (e.g., weakness, numbness)     Residual numbness sensation in arm and leg after stroke 2. ONSET: "When did this start?" (minutes, hours, days; while sleeping)     At time of stroke- it is not better 2 weeks out 3. LAST NORMAL: "When was the last time you were normal (no symptoms)?"     Before stroke 2 weeks ago 4. PATTERN "Does this come and go, or has it been constant since it started?"  "Is it present now?"     constant 5. CARDIAC SYMPTOMS: "Have you had any of the following symptoms: chest pain, difficulty breathing, palpitations?"     no 6. NEUROLOGIC SYMPTOMS: "Have you had any of the following symptoms: headache, dizziness, vision loss, double vision, changes in speech, unsteady on your feet?"     no 7. OTHER SYMPTOMS: "Do you have any other symptoms?"     No other symptoms- no changes since release from hospital- patient has had follow up  with PCP 8. PREGNANCY: "Is there any chance you are pregnant?" "Ina Kick was your last menstrual period?"     n/a  Protocols used: NEUROLOGIC DEFICIT-A-AH

## 2019-05-28 ENCOUNTER — Telehealth: Payer: Self-pay | Admitting: Neurology

## 2019-05-28 NOTE — Telephone Encounter (Signed)
Pt was seen at [facility] for right arm numbness.Pt was not seen by any doctors at our office. He is a new consult. Pt having pain in right arm and numbness.

## 2019-05-28 NOTE — Telephone Encounter (Signed)
Pt is calling in wanting to know what can be done for pain until he is seen in the office. Please advise

## 2019-05-28 NOTE — Telephone Encounter (Signed)
Hi- for some reason I am getting all your phone notes routed to me today.  Not a problem but just wanted to let you know- I will just click through them [place]

## 2019-05-28 NOTE — Telephone Encounter (Signed)
I cannota dvise him till he isseen in the office. He needs to call primary MD and ger advice in the interim

## 2019-05-29 NOTE — Telephone Encounter (Signed)
Agree with plan 

## 2019-05-29 NOTE — Telephone Encounter (Signed)
If patient calls back please r/s him for sooner appt if Dr .Leonie Man for new pt appt. PEr Dr.Sethi he cannot advise him on pain until he is see in the office. Pt was not seen by him in the hospital. This was all left on pts vm. Pt will need to see his PCP for pain issues until his appt at [facility].

## 2019-06-25 ENCOUNTER — Ambulatory Visit: Payer: BC Managed Care – PPO | Admitting: Neurology

## 2019-06-25 ENCOUNTER — Encounter: Payer: Self-pay | Admitting: Neurology

## 2019-06-25 ENCOUNTER — Other Ambulatory Visit: Payer: Self-pay

## 2019-06-25 ENCOUNTER — Encounter

## 2019-06-25 VITALS — BP 130/80 | HR 80 | Temp 98.2°F | Ht 70.0 in | Wt 192.0 lb

## 2019-06-25 DIAGNOSIS — R202 Paresthesia of skin: Secondary | ICD-10-CM | POA: Diagnosis not present

## 2019-06-25 DIAGNOSIS — I6381 Other cerebral infarction due to occlusion or stenosis of small artery: Secondary | ICD-10-CM

## 2019-06-25 DIAGNOSIS — G3184 Mild cognitive impairment, so stated: Secondary | ICD-10-CM

## 2019-06-25 MED ORDER — PREGABALIN 50 MG PO CAPS: 50.0000 mg | ORAL_CAPSULE | Freq: Two times a day (BID) | ORAL | 2 refills | Status: DC

## 2019-06-25 MED ORDER — ROSUVASTATIN CALCIUM 10 MG PO TABS: 10.0000 mg | ORAL_TABLET | Freq: Every day | ORAL | 11 refills | Status: DC

## 2019-06-25 NOTE — Patient Instructions (Signed)
I had a long d/w patient and his daughter about his recent lacunar stroke,post stroke paresthesias and mild cognitive impairment risk for recurrent stroke/TIAs, personally independently reviewed imaging studies and stroke evaluation results and answered questions.Continue aspirin 81 mg daily  for secondary stroke prevention and maintain strict control of hypertension with blood pressure goal below 130/90, diabetes with hemoglobin A1c goal below 6.5% and lipids with LDL cholesterol goal below 70 mg/dL. I also advised the patient to eat a healthy diet with plenty of whole grains, cereals, fruits and vegetables, exercise regularly and maintain ideal body weight.  I have complemented him on quitting smoking and advised him to continue to do so.  We talked about improving his sleep hygiene and he can take melatonin 5 mg at sunset and use other over-the-counter sleep enhancing aids if needed.  Check memory panel labs for treatable causes of cognitive impairment and I also encouraged him to increase participation in cognitively challenging activities like solving crossword puzzles, playing bridge and sudoku.  Followup in the future with me in 2 months or call earlier if necessary. Memory Compensation Strategies  1. Use "WARM" strategy.  W= write it down  A= associate it  R= repeat it  M= make a mental note  2.   You can keep a Glass blower/designer.  Use a 3-ring notebook with sections for the following: calendar, important names and phone numbers,  medications, doctors' names/phone numbers, lists/reminders, and a section to journal what you did  each day.   3.    Use a calendar to write appointments down.  4.    Write yourself a schedule for the day.  This can be placed on the calendar or in a separate section of the Memory Notebook.  Keeping a  regular schedule can help memory.  5.    Use medication organizer with sections for each day or morning/evening pills.  You may need help loading it  6.    Keep a  basket, or pegboard by the door.  Place items that you need to take out with you in the basket or on the pegboard.  You may also want to  include a message board for reminders.  7.    Use sticky notes.  Place sticky notes with reminders in a place where the task is performed.  For example: " turn off the  stove" placed by the stove, "lock the door" placed on the door at eye level, " take your medications" on  the bathroom mirror or by the place where you normally take your medications.  8.    Use alarms/timers.  Use while cooking to remind yourself to check on food or as a reminder to take your medicine, or as a  reminder to make a call, or as a reminder to perform another task, etc.   Stroke Prevention Some medical conditions and behaviors are associated with a higher chance of having a stroke. You can help prevent a stroke by making nutrition, lifestyle, and other changes, including managing any medical conditions you may have. What nutrition changes can be made?   Eat healthy foods. You can do this by: ? Choosing foods high in fiber, such as fresh fruits and vegetables and whole grains. ? Eating at least 5 or more servings of fruits and vegetables a day. Try to fill half of your plate at each meal with fruits and vegetables. ? Choosing lean protein foods, such as lean cuts of meat, poultry without skin, fish, tofu, beans,  and nuts. ? Eating low-fat dairy products. ? Avoiding foods that are high in salt (sodium). This can help lower blood pressure. ? Avoiding foods that have saturated fat, trans fat, and cholesterol. This can help prevent high cholesterol. ? Avoiding processed and premade foods.  Follow your health care provider's specific guidelines for losing weight, controlling high blood pressure (hypertension), lowering high cholesterol, and managing diabetes. These may include: ? Reducing your daily calorie intake. ? Limiting your daily sodium intake to 1,500 milligrams (mg). ?  Using only healthy fats for cooking, such as olive oil, canola oil, or sunflower oil. ? Counting your daily carbohydrate intake. What lifestyle changes can be made?  Maintain a healthy weight. Talk to your health care provider about your ideal weight.  Get at least 30 minutes of moderate physical activity at least 5 days a week. Moderate activity includes brisk walking, biking, and swimming.  Do not use any products that contain nicotine or tobacco, such as cigarettes and e-cigarettes. If you need help quitting, ask your health care provider. It may also be helpful to avoid exposure to secondhand smoke.  Limit alcohol intake to no more than 1 drink a day for nonpregnant women and 2 drinks a day for men. One drink equals 12 oz of beer, 5 oz of wine, or 1 oz of hard liquor.  Stop any illegal drug use.  Avoid taking birth control pills. Talk to your health care provider about the risks of taking birth control pills if: ? You are over 64 years old. ? You smoke. ? You get migraines. ? You have ever had a blood clot. What other changes can be made?  Manage your cholesterol levels. ? Eating a healthy diet is important for preventing high cholesterol. If cholesterol cannot be managed through diet alone, you may also need to take medicines. ? Take any prescribed medicines to control your cholesterol as told by your health care provider.  Manage your diabetes. ? Eating a healthy diet and exercising regularly are important parts of managing your blood sugar. If your blood sugar cannot be managed through diet and exercise, you may need to take medicines. ? Take any prescribed medicines to control your diabetes as told by your health care provider.  Control your hypertension. ? To reduce your risk of stroke, try to keep your blood pressure below 130/80. ? Eating a healthy diet and exercising regularly are an important part of controlling your blood pressure. If your blood pressure cannot be  managed through diet and exercise, you may need to take medicines. ? Take any prescribed medicines to control hypertension as told by your health care provider. ? Ask your health care provider if you should monitor your blood pressure at home. ? Have your blood pressure checked every year, even if your blood pressure is normal. Blood pressure increases with age and some medical conditions.  Get evaluated for sleep disorders (sleep apnea). Talk to your health care provider about getting a sleep evaluation if you snore a lot or have excessive sleepiness.  Take over-the-counter and prescription medicines only as told by your health care provider. Aspirin or blood thinners (antiplatelets or anticoagulants) may be recommended to reduce your risk of forming blood clots that can lead to stroke.  Make sure that any other medical conditions you have, such as atrial fibrillation or atherosclerosis, are managed. What are the warning signs of a stroke? The warning signs of a stroke can be easily remembered as BEFAST.  B is for  balance. Signs include: ? Dizziness. ? Loss of balance or coordination. ? Sudden trouble walking.  E is for eyes. Signs include: ? A sudden change in vision. ? Trouble seeing.  F is for face. Signs include: ? Sudden weakness or numbness of the face. ? The face or eyelid drooping to one side.  A is for arms. Signs include: ? Sudden weakness or numbness of the arm, usually on one side of the body.  S is for speech. Signs include: ? Trouble speaking (aphasia). ? Trouble understanding.  T is for time. ? These symptoms may represent a serious problem that is an emergency. Do not wait to see if the symptoms will go away. Get medical help right away. Call your local emergency services (911 in the [place] Do not drive yourself to the hospital.  Other signs of stroke may include: ? A sudden, severe headache with no known cause. ? Nausea or vomiting. ? Seizure. Where to find  more information For more information, visit:  American Stroke Association: www.strokeassociation.org  National Stroke Association: www.stroke.org Summary  You can prevent a stroke by eating healthy, exercising, not smoking, limiting alcohol intake, and managing any medical conditions you may have.  Do not use any products that contain nicotine or tobacco, such as cigarettes and e-cigarettes. If you need help quitting, ask your health care provider. It may also be helpful to avoid exposure to secondhand smoke.  Remember BEFAST for warning signs of stroke. Get help right away if you or a loved one has any of these signs. This information is not intended to replace advice given to you by your health care provider. Make sure you discuss any questions you have with your health care provider. Document Released: 11/09/2004 Document Revised: 09/14/2017 Document Reviewed: 11/07/2016 Elsevier Patient Education  2020 [employer].

## 2019-06-25 NOTE — Progress Notes (Signed)
[facility] [address] [place]. [place] [address] [phone]       OFFICE CONSULT NOTE  [name] Date of Birth:  05/06/1956 Medical Record Number:  [id number]   Referring MD:  Peter Dominguez  Reason for Referral: Stroke HPI: [name] is a 63 year old African-American male seen today for initial office consultation visit for stroke.  History is obtained from the patient and his wife who is accompanying him as well as review of electronic medical records and have personally reviewed imaging films in PACS.  He is a 63 year old male with no documented past medical history who presented to [facility] emergency room on 05/09/2019 with a 2-day history of right arm and leg numbness.  He states he woke up after a nap and felt his right arm as well as right leg from the mid thigh down felt numb.  This feeling has continued since then has has not abated.  He describes this as being bothersome and he would like needs some medicines for this.  He denied any accompanying headache, slurred speech, vision gait or balance difficulties.  He was evaluated by telemetry specialist and NIH stroke scale was found to be 1.  He presented outside time window for TPA.  MRI scan of the brain was obtained which showed a small acute left thalamic stroke.  CT angiogram of the brain and neck were obtained which showed only minor atherosclerotic changes without any medium or large vessel stenosis or occlusion.  2D echo showed normal ejection fraction without cardiac source of embolism.  LDL cholesterol was elevated at 127 mg percent and hemoglobin A1c was 5.0.  Patient was started on aspirin and Plavix and has finished 3 weeks and is now on aspirin alone.  He was also given a prescription of Lyrica for his paresthesias but apparently never filled it.  Patient has quit smoking since the stroke and is wearing a NicoDerm patch.  He was also found to have enlarged lymph nodes and underwent outpatient CT  scan of the chest on 06/02/2019 which confirmed lymphadenopathy and he plans to undergo biopsy soon to rule out malignancy and would like to hold aspirin for the procedure.  His blood pressure is good is tolerating aspirin well without bruising or bleeding.  He has no prior history of strokes TIAs seizures or significant neurological problems.  Patient is wife both state that he has had some cognitive decline since the stroke is having short-term memory difficulties and trouble remembering appointments and recent information.  ROS:   14 system review of systems is positive for numbness, impaired coordination, balance and walking, memory loss and all other systems negative  PMH:  Past Medical History:  Diagnosis Date   Stroke [facility])     Social History:  Social History   Socioeconomic History   Marital status: Married    Spouse name: Not on file   Number of children: Not on file   Years of education: Not on file   Highest education level: Not on file  Occupational History   Not on file  Social Needs   Financial resource strain: Not on file   Food insecurity    Worry: Not on file    Inability: Not on file   Transportation needs    Medical: Not on file    Non-medical: Not on file  Tobacco Use   Smoking status: Former Smoker    Quit date: 05/13/2019    Years since quitting: 0.1   Smokeless tobacco:  Never Used  Substance and Sexual Activity   Alcohol use: Yes    Alcohol/week: 1.0 standard drinks    Types: 1 Shots of liquor per week    Comment: social    Drug use: No   Sexual activity: Not on file  Lifestyle   Physical activity    Days per week: Not on file    Minutes per session: Not on file   Stress: Not on file  Relationships   Social connections    Talks on phone: Not on file    Gets together: Not on file    Attends religious service: Not on file    Active member of club or organization: Not on file    Attends meetings of clubs or organizations: Not on  file    Relationship status: Not on file   Intimate partner violence    Fear of current or ex partner: Not on file    Emotionally abused: Not on file    Physically abused: Not on file    Forced sexual activity: Not on file  Other Topics Concern   Not on file  Social History Narrative   Not on file    Medications:   Current Outpatient Medications on File Prior to Visit  Medication Sig Dispense Refill   aspirin EC 81 MG EC tablet Take 1 tablet (81 mg total) by mouth daily. 30 tablet 0   co-enzyme Q-10 50 MG capsule Take 1 capsule (50 mg total) by mouth daily. 30 capsule 0   nicotine (NICODERM CQ - DOSED IN MG/24 HOURS) 21 mg/24hr patch Place 1 patch (21 mg total) onto the skin daily. 28 patch 0   Omega-3 1000 MG CAPS Take 1,000 mg by mouth daily.     No current facility-administered medications on file prior to visit.     Allergies:   Allergies  Allergen Reactions   Bupropion Swelling    Physical Exam General: well developed, well nourished middle-aged African-American male seated, in no evident distress Head: head normocephalic and atraumatic.   Neck: supple with no carotid or supraclavicular bruits Cardiovascular: regular rate and rhythm, no murmurs Musculoskeletal: no deformity Skin:  no rash/petichiae Vascular:  Normal pulses all extremities  Neurologic Exam Mental Status: Awake and fully alert. Oriented to place and time. Recent and remote memory intact. Attention span, concentration and fund of knowledge appropriate. Mood and affect appropriate.  Diminished recall 2/3.  Able to name 11 animals which can walk on 4 legs.  Clock drawing score 3/4. Cranial Nerves: Fundoscopic exam reveals sharp disc margins. Pupils equal, briskly reactive to light. Extraocular movements full without nystagmus. Visual fields full to confrontation. Hearing intact. Facial sensation intact. Face, tongue, palate moves normally and symmetrically.  Motor: Normal bulk and tone. Normal  strength in all tested extremity muscles. Sensory.:  Diminished right hemibody sensation to touch pinprick but preserved position and vibration. Coordination: Rapid alternating movements normal in all extremities. Finger-to-nose and heel-to-shin performed accurately bilaterally. Gait and Station: Arises from chair without difficulty. Stance is normal. Gait demonstrates normal stride length and balance . Able to heel, toe and tandem walk with moderate difficulty.  Reflexes: 1+ and symmetric. Toes downgoing.   NIHSS  1 Modified Rankin  2   ASSESSMENT: 63 year old male with right body paresthesias from left subcortical lacunar infarct in July 2020.  He has significant residual right body paresthesias and mild cognitive impairment.  Vascular risk factors of hyperlipidemia and smoking.     PLAN: I had a long  d/w patient and his daughter about his recent lacunar stroke,post stroke paresthesias and mild cognitive impairment risk for recurrent stroke/TIAs, personally independently reviewed imaging studies and stroke evaluation results and answered questions.Continue aspirin 81 mg daily  for secondary stroke prevention and maintain strict control of hypertension with blood pressure goal below 130/90, diabetes with hemoglobin A1c goal below 6.5% and lipids with LDL cholesterol goal below 70 mg/dL. I also advised the patient to eat a healthy diet with plenty of whole grains, cereals, fruits and vegetables, exercise regularly and maintain ideal body weight.  I have complemented him on quitting smoking and advised him to continue to do so.  We talked about improving his sleep hygiene and he can take melatonin 5 mg at sunset and use other over-the-counter sleep enhancing aids if needed.  Check memory panel labs for treatable causes of cognitive impairment and I also encouraged him to increase participation in cognitively challenging activities like solving crossword puzzles, playing bridge and sudoku.  Greater  than 50% time during this 45-minute consultation visit was spent on counseling and coordination of care about his lacunar infarct and mild cognitive impairment and answering questions.  Followup in the future with me in 2 months or call earlier if necessary. Antony Contras, MD  [facility] [address] [place], [place] [address]  Phone [phone] Fax [phone] Note: This document was prepared with digital dictation and possible smart phrase technology. Any transcriptional errors that result from this process are unintentional.

## 2019-06-27 ENCOUNTER — Telehealth: Payer: Self-pay | Admitting: Neurology

## 2019-06-27 LAB — DEMENTIA PANEL
Homocysteine: 26.9 umol/L — ABNORMAL HIGH (ref 0.0–17.2)
RPR Ser Ql: NONREACTIVE
TSH: 0.537 u[IU]/mL (ref 0.450–4.500)
Vitamin B-12: 322 pg/mL (ref 232–1245)

## 2019-06-27 NOTE — Telephone Encounter (Signed)
Pt called wanting to know if it is ok to use his tens unit and there will not be a blood clotting problem by him using it. Please advise.

## 2019-06-30 NOTE — Telephone Encounter (Signed)
I called MIsty pts daughter about her father using the ten unit for pain on his right arm. She stated pt has back problems and was using it prior for pain. He is using on his right arm now for the numbness and pain from the stroke. No MD is managing the ten units for pt he is doing this on his own. They wanted to know if pt uses the ten unit will it cause a blood clotting problems for him because of the stroke he had. The pt has use it one to two hours in the last week. I stated message will be sent to Dr. Leonie Man. The daughter verbalized understanding.

## 2019-07-02 NOTE — Telephone Encounter (Signed)
I called Peter Dominguez pts daughter about Dr Davonna Belling recommendation of using the ten unit on his right arm. I stated per Dr.SEthi he is not aware of any clotting problems using the ten units. Its okay for patient to use. Peter Dominguez verbalized understanding.

## 2019-07-02 NOTE — Telephone Encounter (Signed)
Ok to use TENS unit. I am not aware of clotting problems with it

## 2019-07-10 ENCOUNTER — Telehealth: Payer: Self-pay

## 2019-07-10 NOTE — Telephone Encounter (Signed)
-----   Message from Garvin Fila, MD sent at 07/10/2019  8:21 AM EDT ----- Kindly advise the patient that lab work for reversible causes of memory loss seems fine except for for abnormal homocystine.  She needs to start taking folic acid 1 mg daily which should be available over-the-counter.

## 2019-07-10 NOTE — Telephone Encounter (Signed)
I called pts daughter Rojelio Brenner about pts lab work. I stated the dementia panel labs were normal except for abnormal homocystine. PT needs to start taking taking a folic acid 1mg  which should be available over the counter. The daughter verbalized understanding.  ------

## 2019-07-16 ENCOUNTER — Ambulatory Visit: Payer: BC Managed Care – PPO

## 2019-07-16 ENCOUNTER — Other Ambulatory Visit: Payer: Self-pay

## 2019-07-16 DIAGNOSIS — R410 Disorientation, unspecified: Secondary | ICD-10-CM

## 2019-07-16 DIAGNOSIS — G3184 Mild cognitive impairment, so stated: Secondary | ICD-10-CM

## 2019-08-07 ENCOUNTER — Other Ambulatory Visit: Payer: Self-pay

## 2019-08-07 ENCOUNTER — Telehealth: Payer: Self-pay

## 2019-08-07 MED ORDER — PREGABALIN 50 MG PO CAPS: ORAL_CAPSULE | ORAL | 3 refills | Status: DC

## 2019-08-07 NOTE — Telephone Encounter (Signed)
Dr. Leonie Man increase pts lyrica to 2 tablets in the am  and 2 tablets at night. I fax prescription to listed pharmacy at [phone]. Fax twice and receive.

## 2019-09-01 ENCOUNTER — Other Ambulatory Visit: Payer: Self-pay

## 2019-09-01 ENCOUNTER — Ambulatory Visit: Payer: BC Managed Care – PPO | Admitting: Neurology

## 2019-09-01 ENCOUNTER — Encounter: Payer: Self-pay | Admitting: Neurology

## 2019-09-01 ENCOUNTER — Telehealth: Payer: Self-pay

## 2019-09-01 VITALS — BP 130/77 | HR 93 | Temp 98.2°F | Ht 70.0 in | Wt 202.0 lb

## 2019-09-01 DIAGNOSIS — G4700 Insomnia, unspecified: Secondary | ICD-10-CM

## 2019-09-01 DIAGNOSIS — R202 Paresthesia of skin: Secondary | ICD-10-CM

## 2019-09-01 MED ORDER — TOPIRAMATE 25 MG PO TABS: 25.0000 mg | ORAL_TABLET | Freq: Every day | ORAL | 2 refills | Status: DC

## 2019-09-01 NOTE — Patient Instructions (Signed)
I had a long discussion with the patient with his recent lacunar stroke and post stroke paresthesias which appear quite disabling and answered questions.  I recommend he taper Lyrica to 50 mg twice daily for 1 week and discontinue.  Start Topamax 25 mg twice daily and will increase as tolerated.  Discussed possible side effects with him.  He will stay on aspirin for stroke prevention and maintain strict control of hypertension with blood pressure goal below 130/90, lipids with LDL cholesterol goal below 70 mg percent.  I have again complemented him for his smoking cessation and encouraged him to continue to do so.  Patient is also having insomnia I recommend he take melatonin 10 mg at sunset and not at bedtime.  He was also advised to avoid napping during the day and we also discussed sleep hygiene and quality issues.  He was given a handicap parking sticker upon request.  He will return for follow-up in the future in 3 months with my nurse practitioner Janett Billow or call earlier if necessary.  Quality Sleep Information, Adult Quality sleep is important for your mental and physical health. It also improves your quality of life. Quality sleep means you:  Are asleep for most of the time you are in bed.  Fall asleep within 30 minutes.  Wake up no more than once a night.  Are awake for no longer than 20 minutes if you do wake up during the night. Most adults need 7-8 hours of quality sleep each night. How can poor sleep affect me? If you do not get enough quality sleep, you may have:  Mood swings.  Daytime sleepiness.  Confusion.  Decreased reaction time.  Sleep disorders, such as insomnia and sleep apnea.  Difficulty with: ? Solving problems. ? Coping with stress. ? Paying attention. These issues may affect your performance and productivity at work, school, and at home. Lack of sleep may also put you at higher risk for accidents, suicide, and risky behaviors. If you do not get quality sleep  you may also be at higher risk for several health problems, including:  Infections.  Type 2 diabetes.  Heart disease.  High blood pressure.  Obesity.  Worsening of long-term conditions, like arthritis, kidney disease, depression, Parkinson's disease, and epilepsy. What actions can I take to get more quality sleep?      Stick to a sleep schedule. Go to sleep and wake up at about the same time each day. Do not try to sleep less on weekdays and make up for lost sleep on weekends. This does not work.  Try to get about 30 minutes of exercise on most days. Do not exercise 2-3 hours before going to bed.  Limit naps during the day to 30 minutes or less.  Do not use any products that contain nicotine or tobacco, such as cigarettes or e-cigarettes. If you need help quitting, ask your health care provider.  Do not drink caffeinated beverages for at least 8 hours before going to bed. Coffee, tea, and some sodas contain caffeine.  Do not drink alcohol close to bedtime.  Do not eat large meals close to bedtime.  Do not take naps in the late afternoon.  Try to get at least 30 minutes of sunlight every day. Morning sunlight is best.  Make time to relax before bed. Reading, listening to music, or taking a hot bath promotes quality sleep.  Make your bedroom a place that promotes quality sleep. Keep your bedroom dark, quiet, and at a  comfortable room temperature. Make sure your bed is comfortable. Take out sleep distractions like TV, a computer, smartphone, and bright lights.  If you are lying awake in bed for longer than 20 minutes, get up and do a relaxing activity until you feel sleepy.  Work with your health care provider to treat medical conditions that may affect sleeping, such as: ? Nasal obstruction. ? Snoring. ? Sleep apnea and other sleep disorders.  Talk to your health care provider if you think any of your prescription medicines may cause you to have difficulty falling or  staying asleep.  If you have sleep problems, talk with a sleep consultant. If you think you have a sleep disorder, talk with your health care provider about getting evaluated by a specialist. Where to find more information  National Sleep Foundation website: https://sleepfoundation.org  National Heart, Lung, and Blood Institute (NHLBI): https://hall.info/.pdf  Centers for Disease Control and Prevention (CDC): DetailSports.is Contact a health care provider if you:  Have trouble getting to sleep or staying asleep.  Often wake up very early in the morning and cannot get back to sleep.  Have daytime sleepiness.  Have daytime sleep attacks of suddenly falling asleep and sudden muscle weakness (narcolepsy).  Have a tingling sensation in your legs with a strong urge to move your legs (restless legs syndrome).  Stop breathing briefly during sleep (sleep apnea).  Think you have a sleep disorder or are taking a medicine that is affecting your quality of sleep. Summary  Most adults need 7-8 hours of quality sleep each night.  Getting enough quality sleep is an important part of health and well-being.  Make your bedroom a place that promotes quality sleep and avoid things that may cause you to have poor sleep, such as alcohol, caffeine, smoking, and large meals.  Talk to your health care provider if you have trouble falling asleep or staying asleep. This information is not intended to replace advice given to you by your health care provider. Make sure you discuss any questions you have with your health care provider. Document Released: 01/09/2018 Document Revised: 01/09/2018 Document Reviewed: 01/09/2018 Elsevier Patient Education  2020 [employer].

## 2019-09-01 NOTE — Progress Notes (Signed)
[facility] [address] [place] [address] [phone]       OFFICE FOLLOW UP VISIT NOTE  [name] Date of Birth:  01/12/1956 Medical Record Number:  [id number]   Referring MD:  Peter Dominguez  Reason for Referral: Stroke HPI: Initial visit 06/25/2019;Peter Dominguez is a 63 year old African-American male seen today for initial office consultation visit for stroke.  History is obtained from the patient and his wife who is accompanying him as well as review of electronic medical records and have personally reviewed imaging films in PACS.  He is a 63 year old male with no documented past medical history who presented to [facility] emergency room on 05/09/2019 with a 2-day history of right arm and leg numbness.  He states he woke up after a nap and felt his right arm as well as right leg from the mid thigh down felt numb.  This feeling has continued since then has has not abated.  He describes this as being bothersome and he would like needs some medicines for this.  He denied any accompanying headache, slurred speech, vision gait or balance difficulties.  He was evaluated by telemetry specialist and NIH stroke scale was found to be 1.  He presented outside time window for TPA.  MRI scan of the brain was obtained which showed a small acute left thalamic stroke.  CT angiogram of the brain and neck were obtained which showed only minor atherosclerotic changes without any medium or large vessel stenosis or occlusion.  2D echo showed normal ejection fraction without cardiac source of embolism.  LDL cholesterol was elevated at 127 mg percent and hemoglobin A1c was 5.0.  Patient was started on aspirin and Plavix and has finished 3 weeks and is now on aspirin alone.  He was also given a prescription of Lyrica for his paresthesias but apparently never filled it.  Patient has quit smoking since the stroke and is wearing a NicoDerm patch.  He was also found to have enlarged lymph nodes  and underwent outpatient CT scan of the chest on 06/02/2019 which confirmed lymphadenopathy and he plans to undergo biopsy soon to rule out malignancy and would like to hold aspirin for the procedure.  His blood pressure is good is tolerating aspirin well without bruising or bleeding.  He has no prior history of strokes TIAs seizures or significant neurological problems.  Patient is wife both state that he has had some cognitive decline since the stroke is having short-term memory difficulties and trouble remembering appointments and recent information. Update 09/01/2019 : He returns for follow-up after last visit 2 months ago.  He has not had any recurrent stroke or TIA symptoms.  He remains on aspirin which is tolerating well without bruising or bleeding.  He is quit smoking completely.  Is tolerating Crestor well without muscle aches and pains.  He states he did have lab work done a few weeks ago by primary care physician and cholesterol levels were satisfactory.  He continues to have right-sided post stroke paresthesias which are still bothersome.  He is currently taking Lyrica 500 mg twice daily but feels it is not helping him.  He is not having significant side effects.  Patient states he can barely walk more than 15-20 feet due to the paresthesias in his leg giving out.  He is requesting a handicap parking sticker.  He had lab work done reversible causes of memory loss and it was significant only for elevated homocystine which is likely due to  his smoking but he has quit.  I advised him to start taking folic acid but he has not done so.  He continues to have sleeping difficulties and states he can barely sleep for hours and then cannot go back to sleep.  He does Nap during the day.  He does take melatonin 10 mg but states he takes it at bedtime. ROS:   14 system review of systems is positive for numbness, impaired coordination, balance and walking, memory loss and all other systems negative  PMH:  Past  Medical History:  Diagnosis Date  . Stroke [facility])     Social History:  Social History   Socioeconomic History  . Marital status: Married    Spouse name: Not on file  . Number of children: Not on file  . Years of education: Not on file  . Highest education level: Not on file  Occupational History  . Not on file  Social Needs  . Financial resource strain: Not on file  . Food insecurity    Worry: Not on file    Inability: Not on file  . Transportation needs    Medical: Not on file    Non-medical: Not on file  Tobacco Use  . Smoking status: Former Smoker    Quit date: 05/13/2019    Years since quitting: 0.3  . Smokeless tobacco: Never Used  Substance and Sexual Activity  . Alcohol use: Yes    Alcohol/week: 1.0 standard drinks    Types: 1 Shots of liquor per week    Comment: social   . Drug use: No  . Sexual activity: Not on file  Lifestyle  . Physical activity    Days per week: Not on file    Minutes per session: Not on file  . Stress: Not on file  Relationships  . Social Musician on phone: Not on file    Gets together: Not on file    Attends religious service: Not on file    Active member of club or organization: Not on file    Attends meetings of clubs or organizations: Not on file    Relationship status: Not on file  . Intimate partner violence    Fear of current or ex partner: Not on file    Emotionally abused: Not on file    Physically abused: Not on file    Forced sexual activity: Not on file  Other Topics Concern  . Not on file  Social History Narrative  . Not on file    Medications:   Current Outpatient Medications on File Prior to Visit  Medication Sig Dispense Refill  . aspirin EC 81 MG EC tablet Take 1 tablet (81 mg total) by mouth daily. 30 tablet 0  . clopidogrel (PLAVIX) 75 MG tablet     . Melatonin 10 MG TABS Take by mouth at bedtime.    . Omega-3 1000 MG CAPS Take 1,000 mg by mouth daily.    . rosuvastatin (CRESTOR) 10 MG tablet  Take 1 tablet (10 mg total) by mouth at bedtime. 30 tablet 11   No current facility-administered medications on file prior to visit.     Allergies:   Allergies  Allergen Reactions  . Bupropion Swelling    Physical Exam General: well developed, well nourished middle-aged African-American male seated, in no evident distress Head: head normocephalic and atraumatic.   Neck: supple with no carotid or supraclavicular bruits Cardiovascular: regular rate and rhythm, no murmurs Musculoskeletal: no deformity  Skin:  no rash/petichiae Vascular:  Normal pulses all extremities  Neurologic Exam Mental Status: Awake and fully alert. Oriented to place and time. Recent and remote memory intact. Attention span, concentration and fund of knowledge appropriate. Mood and affect appropriate.  Diminished recall 2/3.  Able to name 11 animals which can walk on 4 legs.  Clock drawing score 3/4. Cranial Nerves: Fundoscopic exam reveals sharp disc margins. Pupils equal, briskly reactive to light. Extraocular movements full without nystagmus. Visual fields full to confrontation. Hearing intact. Facial sensation intact. Face, tongue, palate moves normally and symmetrically.  Motor: Normal bulk and tone. Normal strength in all tested extremity muscles. Sensory.:  Diminished right hemibody sensation to touch pinprick but preserved position and vibration. Coordination: Rapid alternating movements normal in all extremities. Finger-to-nose and heel-to-shin performed accurately bilaterally. Gait and Station: Arises from chair without difficulty. Stance is normal. Gait demonstrates normal stride length and balance . Able to heel, toe and tandem walk with moderate difficulty.  Reflexes: 1+ and symmetric. Toes downgoing.   NIHSS  1 Modified Rankin  2   ASSESSMENT: 63 year old male with right body paresthesias from left subcortical lacunar infarct in July 2020.  He has significant residual right body paresthesias and mild  cognitive impairment.  Vascular risk factors of hyperlipidemia and smoking.     PLAN: I had a long discussion with the patient with his recent lacunar stroke and post stroke paresthesias which appear quite disabling and answered questions.  I recommend he taper Lyrica to 50 mg twice daily for 1 week and discontinue.  Start Topamax 25 mg twice daily and will increase as tolerated.  Discussed possible side effects with him.  He will stay on aspirin for stroke prevention and maintain strict control of hypertension with blood pressure goal below 130/90, lipids with LDL cholesterol goal below 70 mg percent.  I have again complemented him for his smoking cessation and encouraged him to continue to do so.  Patient is also having insomnia I recommend he take melatonin 10 mg at sunset and not at bedtime.  He was also advised to avoid napping during the day and we also discussed sleep hygiene and quality issues.  He was given a handicap parking sticker upon request.  He will return for follow-up in the future in 3 months with my nurse practitioner Shanda BumpsJessica or call earlier if necessary. I also encouraged him to increase participation in cognitively challenging activities like solving crossword puzzles, playing bridge and sudoku.  He was also advised to take folic acid 1 mg daily which is available over-the-counter.  Greater than 50% time during this 25-minute   visit was spent on counseling and coordination of care about his lacunar infarct and mild cognitive impairment and answering questions.   Delia HeadyPramod Hildy Nicholl, MD  [facility] [address] [place] [id number]  Phone [phone] Fax [phone] Note: This document was prepared with digital dictation and possible smart phrase technology. Any transcriptional errors that result from this process are unintentional.

## 2019-09-01 NOTE — Telephone Encounter (Signed)
Handicap parking placard form completed by and sign by Dr. Leonie Man. Permanent placard was check for patient to receive decal. Sent to medical records.

## 2019-10-02 DIAGNOSIS — Z87891 Personal history of nicotine dependence: Secondary | ICD-10-CM | POA: Diagnosis not present

## 2019-10-02 DIAGNOSIS — F17200 Nicotine dependence, unspecified, uncomplicated: Secondary | ICD-10-CM | POA: Diagnosis not present

## 2019-10-27 ENCOUNTER — Other Ambulatory Visit: Payer: Self-pay

## 2019-10-27 ENCOUNTER — Other Ambulatory Visit: Payer: Self-pay | Admitting: *Deleted

## 2019-10-27 MED ORDER — TOPIRAMATE 25 MG PO TABS: ORAL_TABLET | ORAL | 3 refills | Status: DC

## 2019-10-27 MED ORDER — TOPIRAMATE 25 MG PO TABS: 25.0000 mg | ORAL_TABLET | Freq: Three times a day (TID) | ORAL | 3 refills | Status: DC

## 2019-12-03 ENCOUNTER — Ambulatory Visit: Payer: BC Managed Care – PPO | Admitting: Adult Health

## 2019-12-03 ENCOUNTER — Telehealth: Payer: Self-pay | Admitting: Neurology

## 2019-12-03 ENCOUNTER — Encounter: Payer: Self-pay | Admitting: Adult Health

## 2019-12-03 ENCOUNTER — Other Ambulatory Visit: Payer: Self-pay | Admitting: Neurology

## 2019-12-03 ENCOUNTER — Other Ambulatory Visit: Payer: Self-pay

## 2019-12-03 VITALS — BP 130/68 | HR 72 | Temp 97.3°F | Ht 70.0 in | Wt 203.4 lb

## 2019-12-03 DIAGNOSIS — G890 Central pain syndrome: Secondary | ICD-10-CM | POA: Diagnosis not present

## 2019-12-03 DIAGNOSIS — E785 Hyperlipidemia, unspecified: Secondary | ICD-10-CM

## 2019-12-03 DIAGNOSIS — I639 Cerebral infarction, unspecified: Secondary | ICD-10-CM

## 2019-12-03 DIAGNOSIS — I6381 Other cerebral infarction due to occlusion or stenosis of small artery: Secondary | ICD-10-CM

## 2019-12-03 MED ORDER — AMITRIPTYLINE HCL 10 MG PO TABS: 10.0000 mg | ORAL_TABLET | Freq: Every day | ORAL | 3 refills | Status: DC

## 2019-12-03 NOTE — Patient Instructions (Signed)
Continue topamax 50mg  three times daily and recommend trialing amitriptyline 10 mg nightly which can help with nerve pain and sleep  Continue aspirin 81 mg daily  and Crestor  for secondary stroke prevention  Continue to follow up with PCP regarding cholesterol and blood pressure management   Continue to monitor blood pressure at home  Maintain strict control of hypertension with blood pressure goal below 130/90, diabetes with hemoglobin A1c goal below 6.5% and cholesterol with LDL cholesterol (bad cholesterol) goal below 70 mg/dL. I also advised the patient to eat a healthy diet with plenty of whole grains, cereals, fruits and vegetables, exercise regularly and maintain ideal body weight.  Followup in the future with me in 4 months or call earlier if needed       Thank you for coming to see at [facility]. I hope we have been able to provide you high quality care today.  You may receive a patient satisfaction survey over the next few weeks. We would appreciate your feedback and comments so that we may continue to improve ourselves and the health of our patients.

## 2019-12-03 NOTE — Telephone Encounter (Signed)
The daughter called, the patient was to go on amitriptyline but the prescription was never called in, I sent in the prescription for amitriptyline 10 mg at night.

## 2019-12-03 NOTE — Progress Notes (Signed)
[facility] [address] [place] [address] [phone]       OFFICE FOLLOW UP VISIT NOTE  Mr. Dushaun Okey Date of Birth:  July 24, 1956 Medical Record Number:  [id number]   Referring MD:  Georgiana Spinner Aroor  Reason for Referral: Stroke  Chief Complaint  Patient presents with  . Follow-up    3 mon f/u. Daughter present. Treatment room. Patient mentioned that he feels like he is getting worse and no better.      HPI: Initial visit 06/25/2019;Mr. Lightner is a 64 year old African-American maleerican male seen today for initial office consultation visit for stroke.  History is obtained from the patient and his wife who is accompanying him as well as review of electronic medical records and have personally reviewed imaging films in PACS.  He is a 64 year old male with no documented past medical history who presented to [facility] emergency room on 05/09/2019 with a 2-day history of right arm and leg numbness.  He states he woke up after a nap and felt his right arm as well as right leg from the mid thigh down felt numb.  This feeling has continued since then has has not abated.  He describes this as being bothersome and he would like needs some medicines for this.  He denied any accompanying headache, slurred speech, vision gait or balance difficulties.  He was evaluated by telemetry specialist and NIH stroke scale was found to be 1.  He presented outside time window for TPA.  MRI scan of the brain was obtained which showed a small acute left thalamic stroke.  CT angiogram of the brain and neck were obtained which showed only minor atherosclerotic changes without any medium or large vessel stenosis or occlusion.  2D echo showed normal ejection fraction without cardiac source of embolism.  LDL cholesterol was elevated at 127 mg percent and hemoglobin A1c was 5.0.  Patient was started on aspirin and Plavix and has finished 3 weeks and is now on aspirin alone.  He was also given a prescription of  Lyrica for his paresthesias but apparently never filled it.  Patient has quit smoking since the stroke and is wearing a NicoDerm patch.  He was also found to have enlarged lymph nodes and underwent outpatient CT scan of the chest on 06/02/2019 which confirmed lymphadenopathy and he plans to undergo biopsy soon to rule out malignancy and would like to hold aspirin for the procedure.  His blood pressure is good is tolerating aspirin well without bruising or bleeding.  He has no prior history of strokes TIAs seizures or significant neurological problems.  Patient is wife both state that he has had some cognitive decline since the stroke is having short-term memory difficulties and trouble remembering appointments and recent information.  Update 09/01/2019 PS : He returns for follow-up after last visit 2 months ago.  He has not had any recurrent stroke or TIA symptoms.  He remains on aspirin which is tolerating well without bruising or bleeding.  He is quit smoking completely.  Is tolerating Crestor well without muscle aches and pains.  He states he did have lab work done a few weeks ago by primary care physician and cholesterol levels were satisfactory.  He continues to have right-sided post stroke paresthesias which are still bothersome.  He is currently taking Lyrica 500 mg twice daily but feels it is not helping him.  He is not having significant side effects.  Patient states he can barely walk more than 15-20 feet due  to the paresthesias in his leg giving out.  He is requesting a handicap parking sticker.  He had lab work done reversible causes of memory loss and it was significant only for elevated homocystine which is likely due to his smoking but he has quit.  I advised him to start taking folic acid but he has not done so.  He continues to have sleeping difficulties and states he can barely sleep for hours and then cannot go back to sleep.  He does Nap during the day.  He does take melatonin 10 mg but states  he takes it at bedtime.  Update 12/03/2019: Mr. Gillie is a 64 year old African-American malerican male seen today for stroke follow up who is accompanied by his daughter today.  Residual stroke deficits of right-sided paresthesias and short-term memory concerns.  He also continues to complain of sleeping difficulty.  Previously on Lyrica without benefit therefore discontinued and initiated Topamax at prior visit and denies benefit.  He remains on aspirin and Crestor for secondary stroke prevention and is tolerating them well without bleeding/bruising or myaglias. Had recent lipid panel completed by his PCP in 08/2019. He denies any new or worsening of stroke symptoms at this time.    ROS:   14 system review of systems is positive for right-sided paresthesias and sleeping difficulites  and all other systems negative  PMH:  Past Medical History:  Diagnosis Date  . Stroke [facility])     Social History:  Social History   Socioeconomic History  . Marital status: Married    Spouse name: Not on file  . Number of children: Not on file  . Years of education: Not on file  . Highest education level: Not on file  Occupational History  . Not on file  Tobacco Use  . Smoking status: Former Smoker    Quit date: 05/13/2019    Years since quitting: 0.5  . Smokeless tobacco: Never Used  Substance and Sexual Activity  . Alcohol use: Yes    Alcohol/week: 1.0 standard drinks    Types: 1 Shots of liquor per week    Comment: social   . Drug use: No  . Sexual activity: Not on file  Other Topics Concern  . Not on file  Social History Narrative  . Not on file   Social Determinants of Health   Financial Resource Strain:   . Difficulty of Paying Living Expenses: Not on file  Food Insecurity:   . Worried About Programme researcher, broadcasting/film/video in the Last Year: Not on file  . Ran Out of Food in the Last Year: Not on file  Transportation Needs:   . Lack of Transportation (Medical): Not on file  . Lack of Transportation  (Non-Medical): Not on file  Physical Activity:   . Days of Exercise per Week: Not on file  . Minutes of Exercise per Session: Not on file  Stress:   . Feeling of Stress : Not on file  Social Connections:   . Frequency of Communication with Friends and Family: Not on file  . Frequency of Social Gatherings with Friends and Family: Not on file  . Attends Religious Services: Not on file  . Active Member of Clubs or Organizations: Not on file  . Attends Banker Meetings: Not on file  . Marital Status: Not on file  Intimate Partner Violence:   . Fear of Current or Ex-Partner: Not on file  . Emotionally Abused: Not on file  . Physically Abused: Not on  file  . Sexually Abused: Not on file    Medications:   Current Outpatient Medications on File Prior to Visit  Medication Sig Dispense Refill  . aspirin EC 81 MG EC tablet Take 1 tablet (81 mg total) by mouth daily. 30 tablet 0  . clopidogrel (PLAVIX) 75 MG tablet     . Melatonin 10 MG TABS Take by mouth at bedtime.    . Omega-3 1000 MG CAPS Take 1,000 mg by mouth daily.    . rosuvastatin (CRESTOR) 10 MG tablet Take 1 tablet (10 mg total) by mouth at bedtime. 30 tablet 11  . topiramate (TOPAMAX) 25 MG tablet Take 1 tablet (25 mg total) by mouth 3 (three) times daily. Take 2 tablets 25mg  by mouth 3 times daily 180 tablet 3   No current facility-administered medications on file prior to visit.    Allergies:   Allergies  Allergen Reactions  . Bupropion Swelling    Physical Exam  Today's Vitals   12/03/19 1058  BP: 130/68  Pulse: 72  Temp: (!) 97.3 F (36.3 C)  TempSrc: Oral  Weight: 203 lb 6.4 oz (92.3 kg)  Height: 5\' 10"  (1.778 m)   Body mass index is 29.18 kg/m.  General: well developed, well nourished middle-aged African-American male seated, in no evident distress Head: head normocephalic and atraumatic.   Neck: supple with no carotid or supraclavicular bruits Cardiovascular: regular rate and rhythm, no  murmurs Musculoskeletal: no deformity Skin:  no rash/petichiae Vascular:  Normal pulses all extremities  Neurologic Exam Mental Status: Awake and fully alert. Oriented to place and time. Recent memory subjectively diminished and remote memory intact. Attention span, concentration and fund of knowledge appropriate during visit. Mood and affect appropriate.  Cranial Nerves: Pupils equal, briskly reactive to light. Extraocular movements full without nystagmus. Visual fields full to confrontation. Hearing intact. Facial sensation intact. Face, tongue, palate moves normally and symmetrically.  Motor: Normal bulk and tone. Normal strength in all tested extremity muscles. Sensory.:  Diminished right hemibody sensation to touch pinprick but preserved position and vibration. Coordination: Rapid alternating movements normal in all extremities. Finger-to-nose and heel-to-shin performed accurately bilaterally. Gait and Station: Arises from chair without difficulty. Stance is normal. Gait demonstrates normal stride length and balance . Able to heel, toe and tandem walk with moderate difficulty.  Reflexes: 1+ and symmetric. Toes downgoing.      ASSESSMENT: 64 year old male with right body paresthesias from left thalamic stroke in July 2020.  He has significant residual right body paresthesias and sleeping difficulities.  No benefit with Lyrica and denies benefit with Topamax. Vascular risk factors of hyperlipidemia.    PLAN: -Continue Topamax 25 mg 3 times daily.  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  Long discussion with patient and daughter regarding difficulty treating paresthesias post stroke but patient is willing to trial amitriptyline at this time.  Discussion regarding vaccination daughter verbalized understanding -Continue aspirin and crestor for secondary stroke prevention  -Recommend routine exercise and healthy  diet -Currently receiving disability from provider prior to stroke for left-sided sciatica.  Advised patient and daughter that ongoing management of disability and any included paperwork will need to be done by prior provider or PCP -maintain strict control of hypertension with blood pressure goal below 130/90, lipids with LDL cholesterol goal below 70 mg percent.   Follow-up in the future in 4  months or call sooner if necessary.    Greater than 50% time during  this 30-minute visit was spent on counseling and coordination of care about his prior stroke with residual right-sided paresthesias and potential use of medications, discussion regarding importance of managing stroke risk factors and answering questions patient and daughter satisfaction   Frann Rider, NP  [facility] [address] [place], [place] [address]  Phone [phone] Fax [phone]

## 2019-12-04 NOTE — Progress Notes (Signed)
I agree with the above plan 

## 2019-12-08 ENCOUNTER — Telehealth: Payer: Self-pay | Admitting: *Deleted

## 2019-12-08 NOTE — Telephone Encounter (Signed)
Pt need to take form to his primary care doctor, we are unable to fill out  at this time. The fee was refund to the visa card.

## 2019-12-30 ENCOUNTER — Other Ambulatory Visit: Payer: Self-pay | Admitting: Neurology

## 2020-01-02 DIAGNOSIS — M5416 Radiculopathy, lumbar region: Secondary | ICD-10-CM | POA: Diagnosis not present

## 2020-01-07 DIAGNOSIS — M5416 Radiculopathy, lumbar region: Secondary | ICD-10-CM | POA: Diagnosis not present

## 2020-02-05 DIAGNOSIS — M4726 Other spondylosis with radiculopathy, lumbar region: Secondary | ICD-10-CM | POA: Diagnosis not present

## 2020-02-05 DIAGNOSIS — M5416 Radiculopathy, lumbar region: Secondary | ICD-10-CM | POA: Diagnosis not present

## 2020-03-08 DIAGNOSIS — E7800 Pure hypercholesterolemia, unspecified: Secondary | ICD-10-CM | POA: Diagnosis not present

## 2020-03-08 DIAGNOSIS — I639 Cerebral infarction, unspecified: Secondary | ICD-10-CM | POA: Diagnosis not present

## 2020-03-08 DIAGNOSIS — N400 Enlarged prostate without lower urinary tract symptoms: Secondary | ICD-10-CM | POA: Diagnosis not present

## 2020-03-08 DIAGNOSIS — N529 Male erectile dysfunction, unspecified: Secondary | ICD-10-CM | POA: Diagnosis not present

## 2020-03-29 ENCOUNTER — Other Ambulatory Visit: Payer: Self-pay | Admitting: Neurology

## 2020-03-31 ENCOUNTER — Ambulatory Visit: Payer: BC Managed Care – PPO | Admitting: Adult Health

## 2020-03-31 DIAGNOSIS — M47816 Spondylosis without myelopathy or radiculopathy, lumbar region: Secondary | ICD-10-CM | POA: Diagnosis not present

## 2020-04-01 DIAGNOSIS — G890 Central pain syndrome: Secondary | ICD-10-CM | POA: Diagnosis not present

## 2020-04-01 DIAGNOSIS — I69398 Other sequelae of cerebral infarction: Secondary | ICD-10-CM | POA: Diagnosis not present

## 2020-04-01 DIAGNOSIS — M5442 Lumbago with sciatica, left side: Secondary | ICD-10-CM | POA: Diagnosis not present

## 2020-04-21 DIAGNOSIS — M47816 Spondylosis without myelopathy or radiculopathy, lumbar region: Secondary | ICD-10-CM | POA: Diagnosis not present

## 2020-05-23 ENCOUNTER — Other Ambulatory Visit: Payer: Self-pay | Admitting: Neurology

## 2020-05-25 NOTE — Telephone Encounter (Signed)
Refilled crestor x 4 months with note to pharmacy: needs to schedule FU for future refills.

## 2020-06-14 DIAGNOSIS — M47816 Spondylosis without myelopathy or radiculopathy, lumbar region: Secondary | ICD-10-CM | POA: Diagnosis not present

## 2020-06-28 DIAGNOSIS — M47816 Spondylosis without myelopathy or radiculopathy, lumbar region: Secondary | ICD-10-CM | POA: Diagnosis not present

## 2020-07-16 DIAGNOSIS — R202 Paresthesia of skin: Secondary | ICD-10-CM | POA: Diagnosis not present

## 2020-07-16 DIAGNOSIS — I69398 Other sequelae of cerebral infarction: Secondary | ICD-10-CM | POA: Diagnosis not present

## 2020-07-16 DIAGNOSIS — G890 Central pain syndrome: Secondary | ICD-10-CM | POA: Diagnosis not present

## 2020-07-27 DIAGNOSIS — M47816 Spondylosis without myelopathy or radiculopathy, lumbar region: Secondary | ICD-10-CM | POA: Diagnosis not present

## 2020-09-13 DIAGNOSIS — Z0000 Encounter for general adult medical examination without abnormal findings: Secondary | ICD-10-CM | POA: Diagnosis not present

## 2020-09-13 DIAGNOSIS — F17200 Nicotine dependence, unspecified, uncomplicated: Secondary | ICD-10-CM | POA: Diagnosis not present

## 2020-09-13 DIAGNOSIS — E7800 Pure hypercholesterolemia, unspecified: Secondary | ICD-10-CM | POA: Diagnosis not present

## 2020-09-13 DIAGNOSIS — N529 Male erectile dysfunction, unspecified: Secondary | ICD-10-CM | POA: Diagnosis not present

## 2020-09-13 DIAGNOSIS — I639 Cerebral infarction, unspecified: Secondary | ICD-10-CM | POA: Diagnosis not present

## 2020-10-08 IMAGING — CT CT HEAD WITHOUT CONTRAST
3 series · 16 of 47 positions shown, 19 images · non-contrast
Comparison: None.

CLINICAL DATA: Right hand numbness

EXAM:
CT HEAD WITHOUT CONTRAST
TECHNIQUE: Contiguous axial images were obtained from the base of the skull
through the vertex without intravenous contrast.

[Series 2: head wo · axial · 0.44mm/px · z∈[+1192,+1332]mm · 10 of 34 slices shown, 13 images]
[im 3/34  brain]
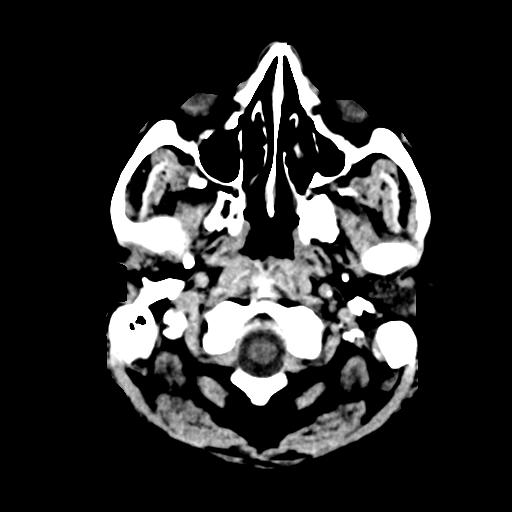
[im 3/34  bone]
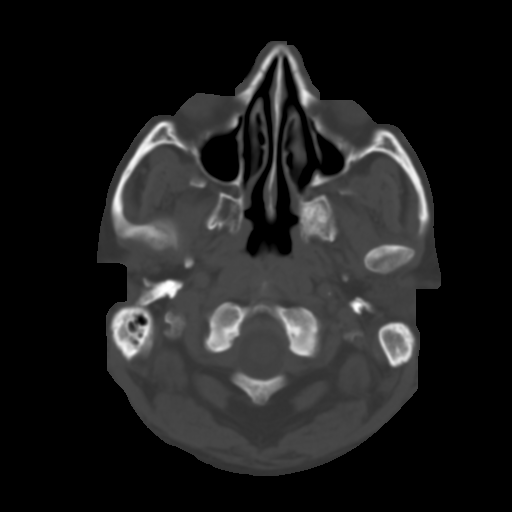
[im 6/34  brain]
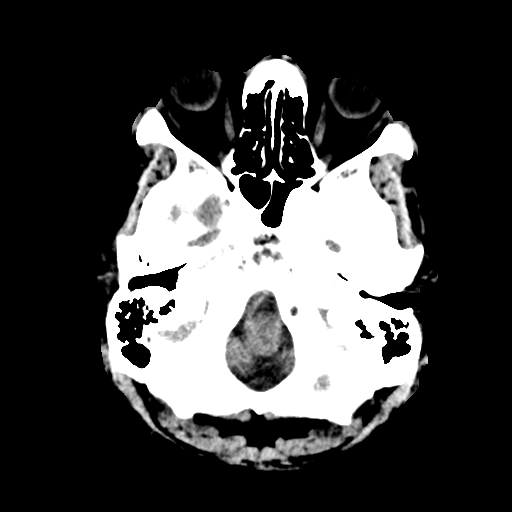
[im 10/34  brain]
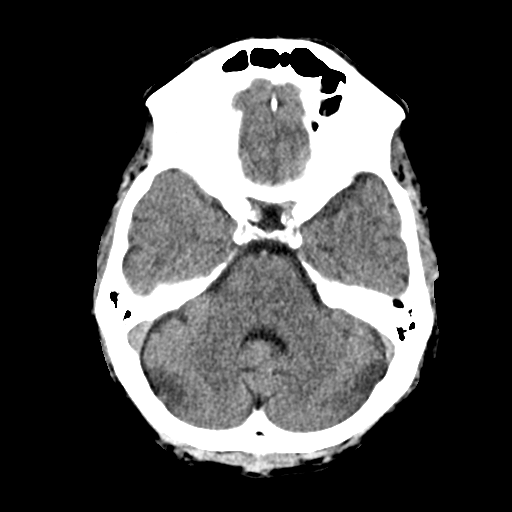
[im 12/34  brain]
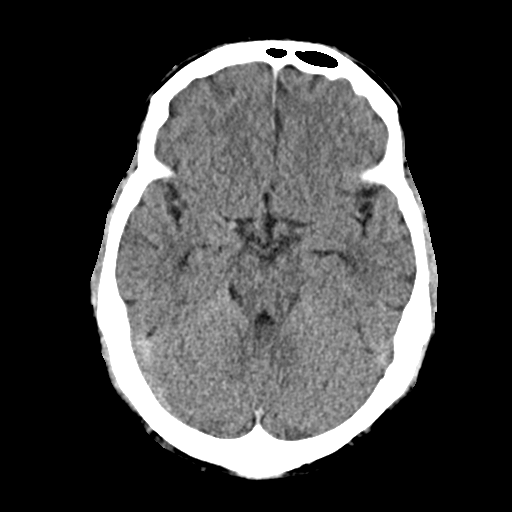
[im 15/34  brain]
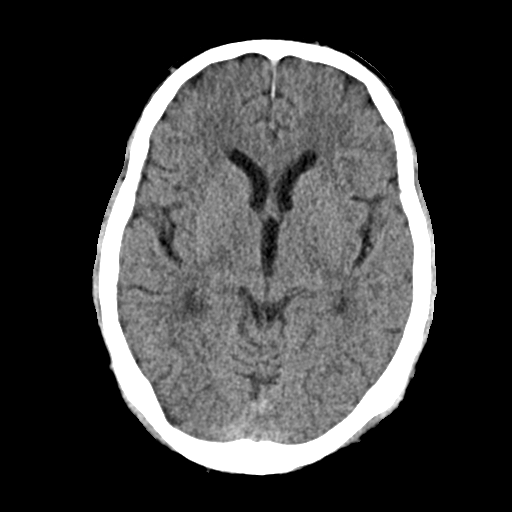
[im 15/34  bone]
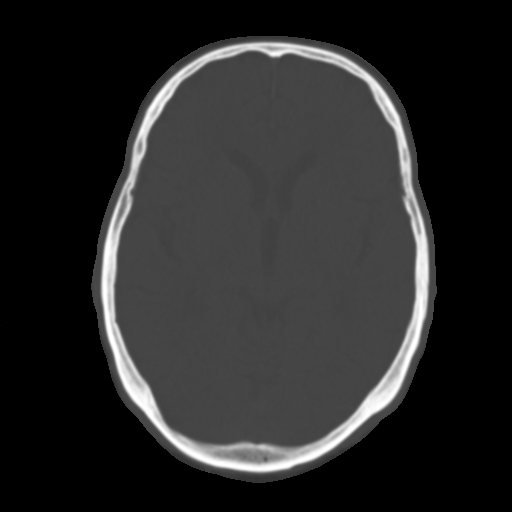
[im 19/34  brain]
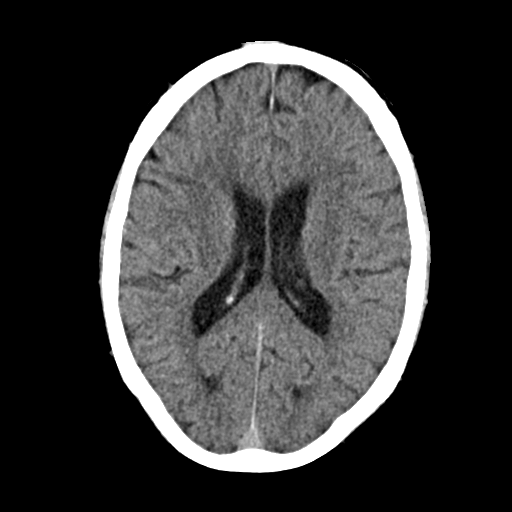
[im 22/34  brain]
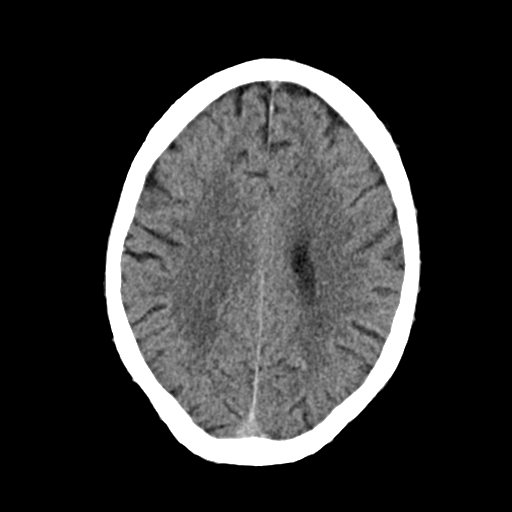
[im 26/34  brain]
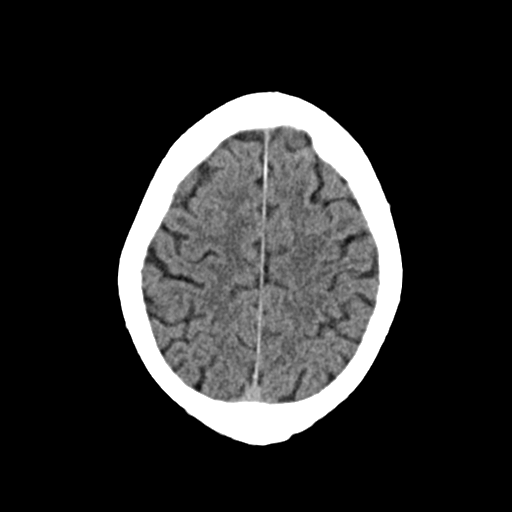
[im 28/34  brain]
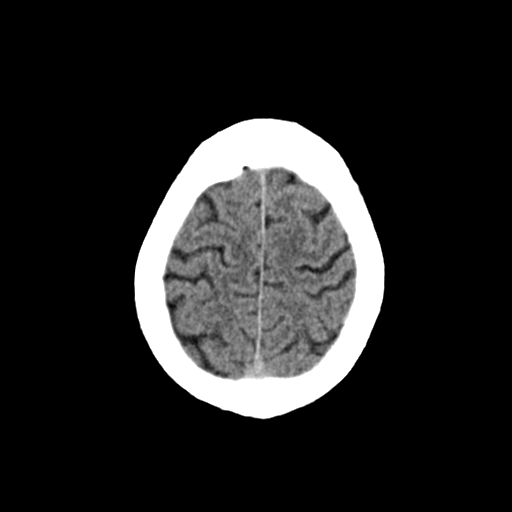
[im 28/34  bone]
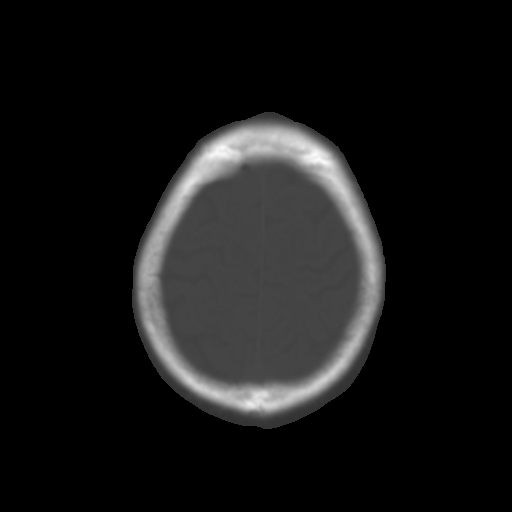
[im 31/34  brain]
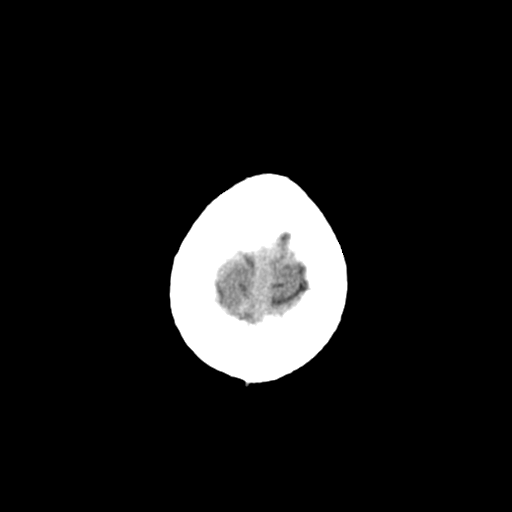

[Series 4: coronal soft · coronal · 0.33mm/px · 3 of 69 slices shown]
[im 23/69  brain]
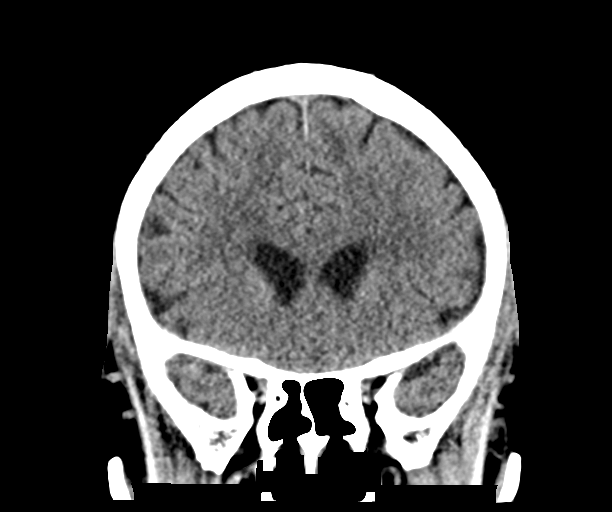
[im 31/69  brain]
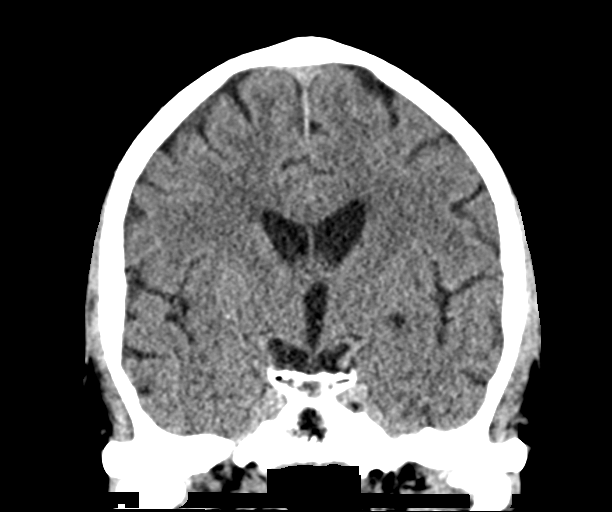
[im 38/69  brain]
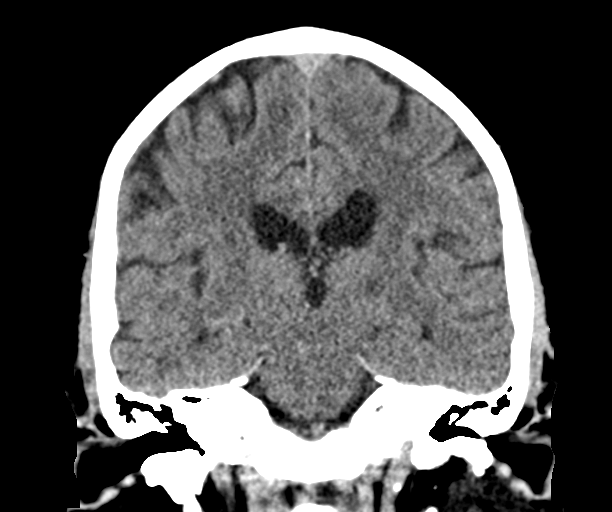

[Series 5: sag soft · sagittal · 0.33mm/px · 3 of 68 slices shown]
[im 23/68  brain]
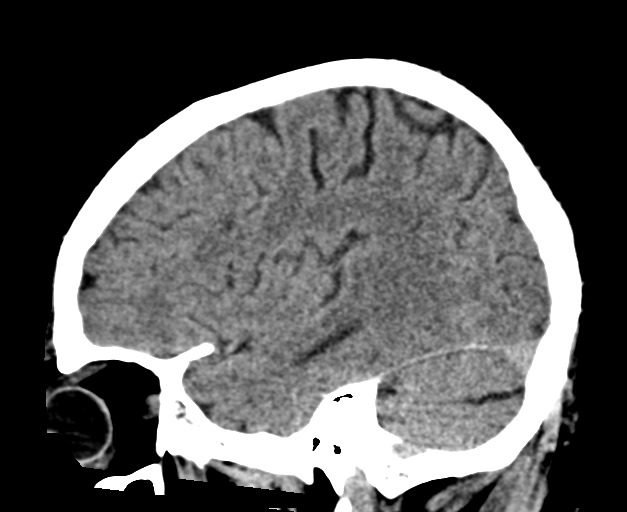
[im 34/68  brain]
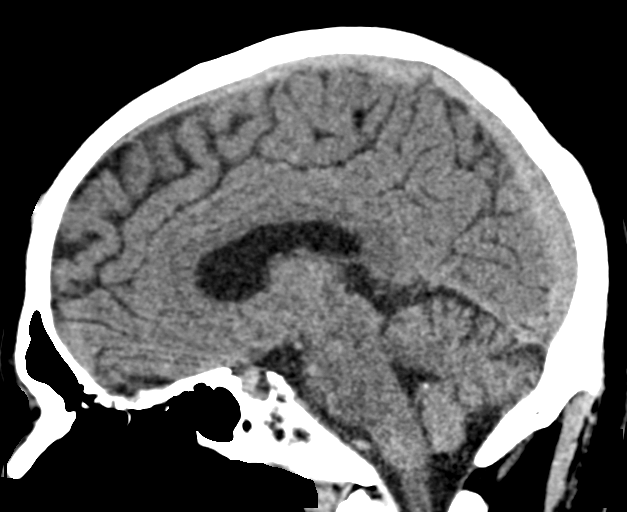
[im 45/68  brain]
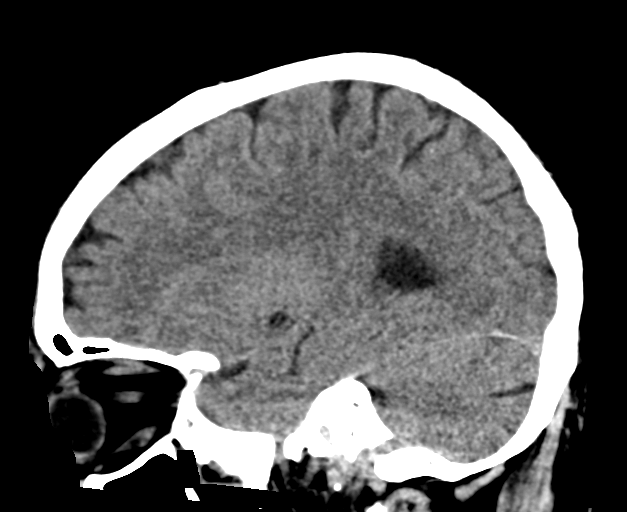

[16 of 47 positions shown; findings below may reference images not displayed]

FINDINGS: Brain: Small focal well-circumscribed low-density in the region of
the left thalamus, likely remote lacunar infarct. No evidence of
acute infarction, hemorrhage, hydrocephalus, extra-axial collection
or mass lesion/mass effect.

Vascular: No hyperdense vessel or unexpected calcification.

Skull: Normal. Negative for fracture or focal lesion.

Sinuses/Orbits: No acute finding.

Other: None.
IMPRESSION: No acute intracranial findings.

## 2020-10-08 IMAGING — MR MRI HEAD WITHOUT CONTRAST
12 of 13 series · 44 of 48 positions shown · non-contrast
Comparison: None.

CLINICAL DATA: Stroke follow-up. Acute onset right arm and leg
numbness.

EXAM:
MRI HEAD WITHOUT CONTRAST
TECHNIQUE: Multiplanar, multiecho pulse sequences of the brain and surrounding
structures were obtained without intravenous contrast.

[Series 5: DWI · axial · 3.0mm · 0.88mm/px · z∈[-47,+95]mm · 8 of 100 slices shown (1 of 4)]
[im 1/100]
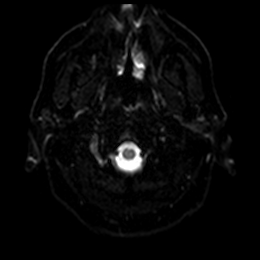
[im 15/100]
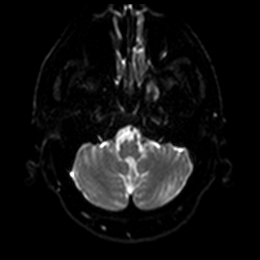
[im 29/100]
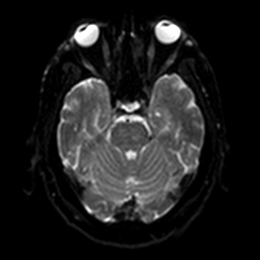
[im 43/100]
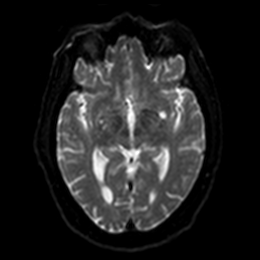
[im 57/100]
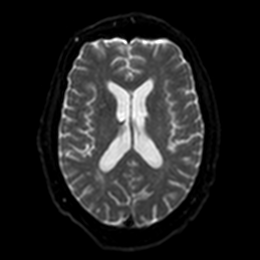
[im 71/100]
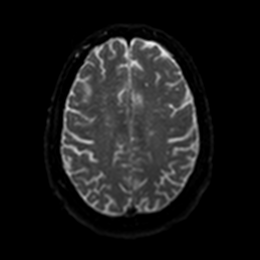
[im 85/100]
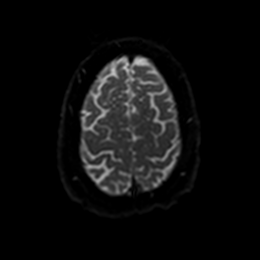
[im 100/100]
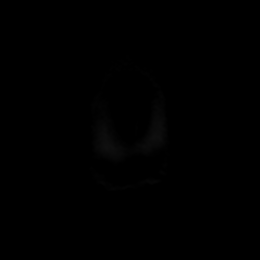

[Series 6: DWI · axial · 3.0mm · 0.88mm/px · z∈[-47,+95]mm · 4 of 50 slices shown (2 of 4)]
[im 1/50]
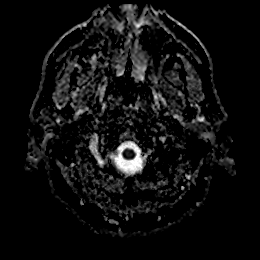
[im 17/50]
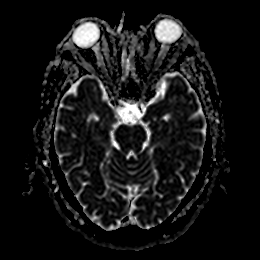
[im 33/50]
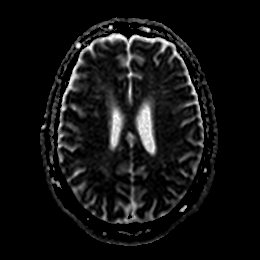
[im 50/50]
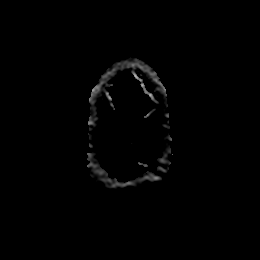

[Series 7: DWI · coronal · 4.0mm · 0.88mm/px · 5 of 72 slices shown (3 of 4)]
[im 1/72]
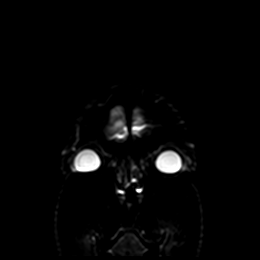
[im 18/72]
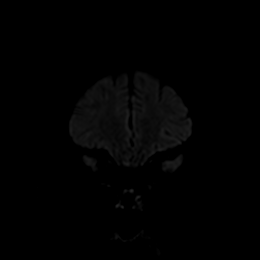
[im 36/72]
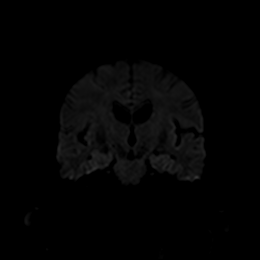
[im 54/72]
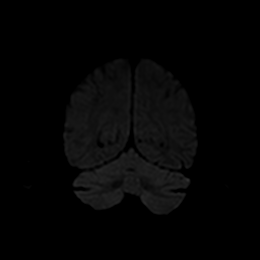
[im 72/72]
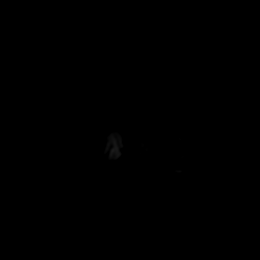

[Series 8: DWI · coronal · 4.0mm · 0.88mm/px · 3 of 36 slices shown (4 of 4)]
[im 1/36]
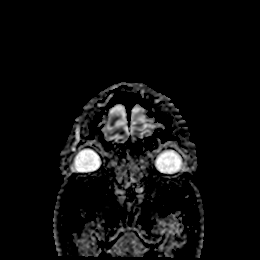
[im 18/36]
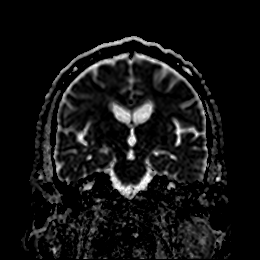
[im 36/36]
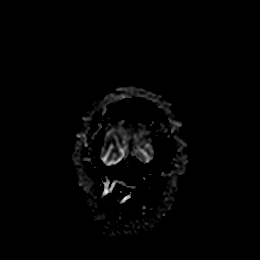

[Series 9: T1 · sagittal · 5.0mm · 0.75mm/px · 2 of 25 slices shown]
[im 1/25]
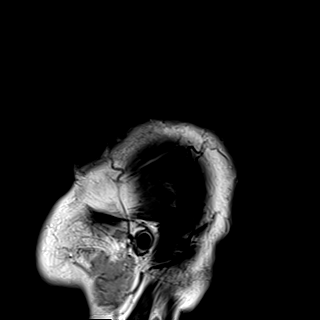
[im 25/25]
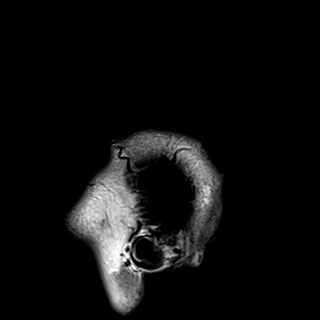

[Series 10: T2 · axial · 5.0mm · 0.72mm/px · z∈[-55,+95]mm · 2 of 27 slices shown (1 of 2)]
[im 1/27]
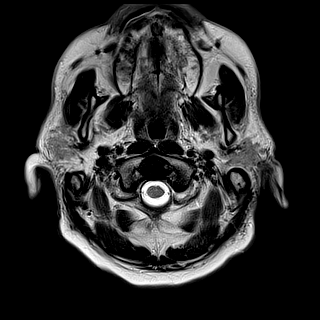
[im 27/27]
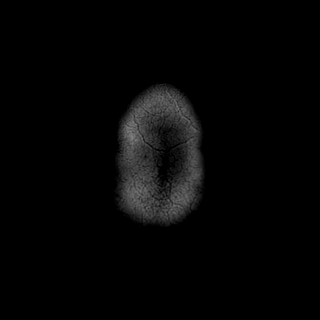

[Series 11: FLAIR · axial · 5.0mm · 0.45mm/px · z∈[-54,+97]mm · 2 of 27 slices shown]
[im 1/27]
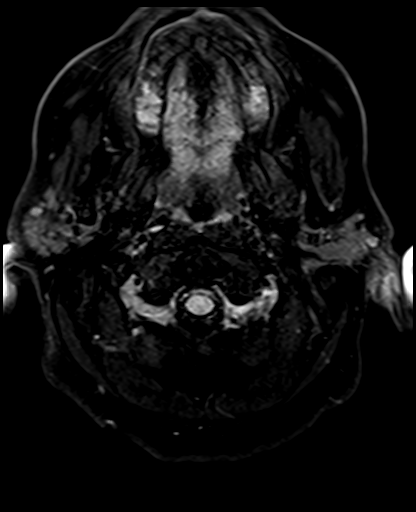
[im 27/27]
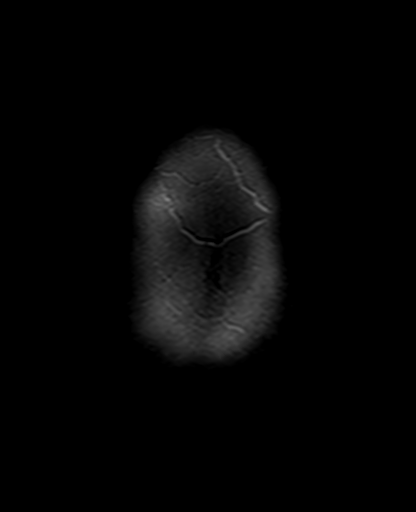

[Series 12: mag_images · axial · 3.0mm · 0.90mm/px · z∈[-57,+114]mm · 4 of 60 slices shown]
[im 1/60]
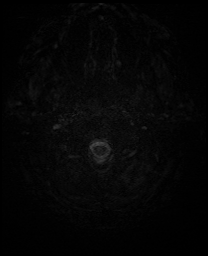
[im 20/60]
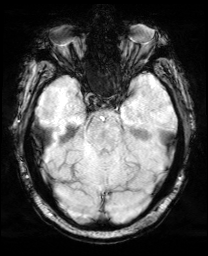
[im 40/60]
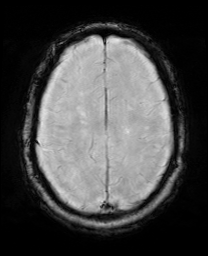
[im 60/60]
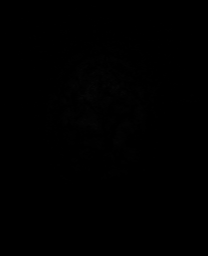

[Series 13: pha_images · axial · 3.0mm · 0.90mm/px · z∈[-57,+114]mm · 4 of 60 slices shown]
[im 1/60]
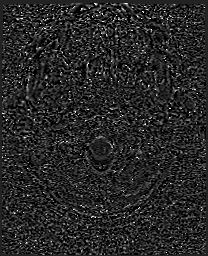
[im 20/60]
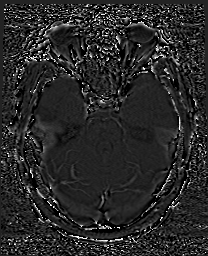
[im 40/60]
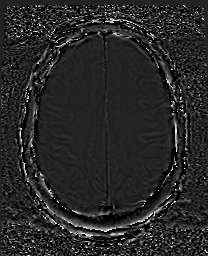
[im 60/60]
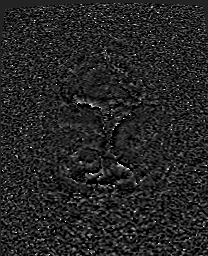

[Series 14: swi_images · axial · 3.0mm · 0.90mm/px · z∈[-57,+114]mm · 4 of 60 slices shown]
[im 1/60]
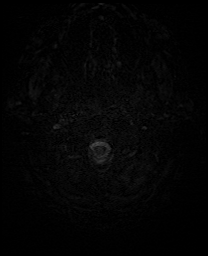
[im 20/60]
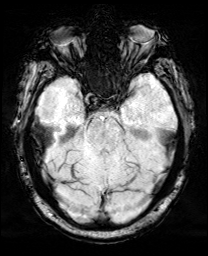
[im 40/60]
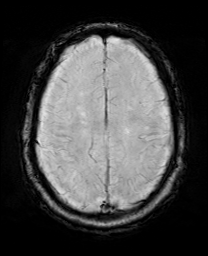
[im 60/60]
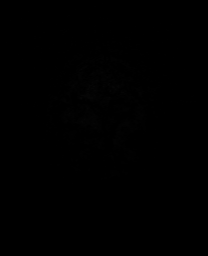

[Series 15: mip_images(sw) · axial · 24.0mm · 0.90mm/px · z∈[-46,+104]mm · 4 of 53 slices shown]
[im 1/53]
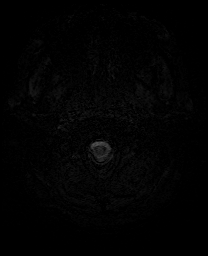
[im 18/53]
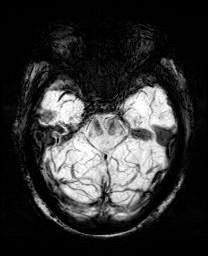
[im 35/53]
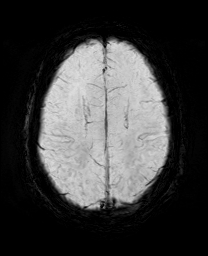
[im 53/53]
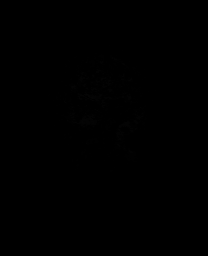

[Series 17: T2 · coronal · 5.0mm · 0.34mm/px · 2 of 31 slices shown (2 of 2)]
[im 1/31]
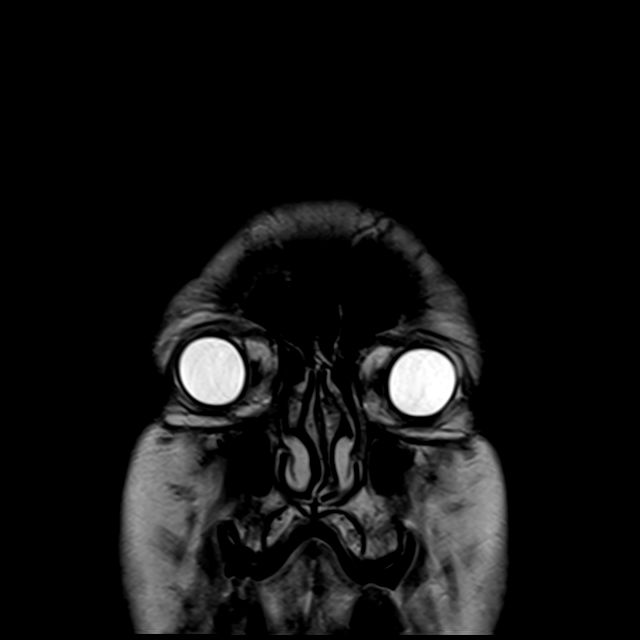
[im 31/31]
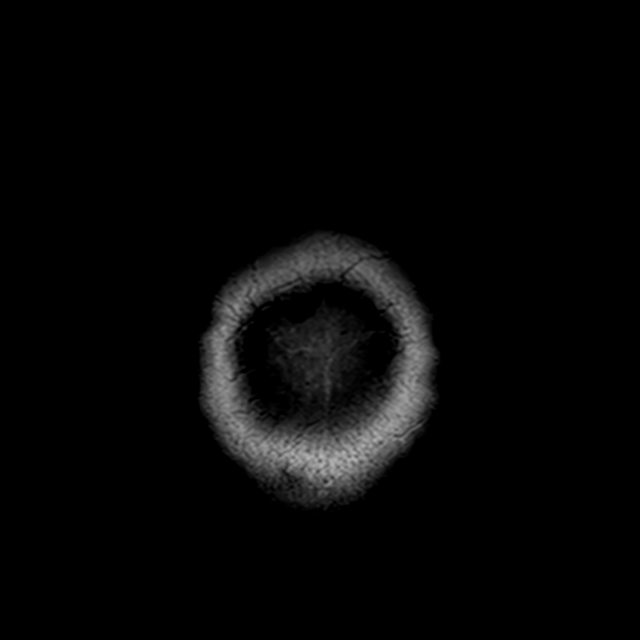

[44 of 48 positions shown; findings below may reference images not displayed]

FINDINGS: BRAIN: There is a small focus of abnormal diffusion restriction
within the left thalamus. This is near the posterior limb of the
left internal capsule. Multifocal white matter hyperintensity, most
commonly due to chronic ischemic microangiopathy. The cerebral and
cerebellar volume are age-appropriate. There is no hydrocephalus.
The midline structures are normal. There is no midline shift or mass
effect. Susceptibility-sensitive sequences show no chronic
microhemorrhage or superficial siderosis.

VASCULAR: The major intracranial arterial and venous sinus flow
voids are normal.

SKULL AND UPPER CERVICAL SPINE: Calvarial bone marrow signal is
normal. There is no skull base mass. The visualized upper cervical
spine and soft tissues are normal.

SINUSES/ORBITS: There are no fluid levels or advanced mucosal
thickening. The mastoid air cells and middle ear cavities are free
of fluid. The orbits are normal.
IMPRESSION: 1. Small acute infarct within the left thalamus, near the posterior
limb of the internal capsule, in keeping with reported right-sided
numbness.
2. Mild chronic ischemic microangiopathy.

## 2020-10-22 DIAGNOSIS — R202 Paresthesia of skin: Secondary | ICD-10-CM | POA: Diagnosis not present

## 2020-10-22 DIAGNOSIS — I69398 Other sequelae of cerebral infarction: Secondary | ICD-10-CM | POA: Diagnosis not present

## 2020-10-22 DIAGNOSIS — G890 Central pain syndrome: Secondary | ICD-10-CM | POA: Diagnosis not present

## 2021-02-21 DIAGNOSIS — G890 Central pain syndrome: Secondary | ICD-10-CM | POA: Diagnosis not present

## 2021-02-21 DIAGNOSIS — R202 Paresthesia of skin: Secondary | ICD-10-CM | POA: Diagnosis not present

## 2021-02-21 DIAGNOSIS — I69398 Other sequelae of cerebral infarction: Secondary | ICD-10-CM | POA: Diagnosis not present

## 2021-02-21 DIAGNOSIS — M47817 Spondylosis without myelopathy or radiculopathy, lumbosacral region: Secondary | ICD-10-CM | POA: Diagnosis not present

## 2021-03-24 DIAGNOSIS — E7800 Pure hypercholesterolemia, unspecified: Secondary | ICD-10-CM | POA: Diagnosis not present

## 2021-03-25 DIAGNOSIS — E7800 Pure hypercholesterolemia, unspecified: Secondary | ICD-10-CM | POA: Diagnosis not present

## 2021-03-25 DIAGNOSIS — F17200 Nicotine dependence, unspecified, uncomplicated: Secondary | ICD-10-CM | POA: Diagnosis not present

## 2021-03-25 DIAGNOSIS — N529 Male erectile dysfunction, unspecified: Secondary | ICD-10-CM | POA: Diagnosis not present

## 2021-03-25 DIAGNOSIS — N400 Enlarged prostate without lower urinary tract symptoms: Secondary | ICD-10-CM | POA: Diagnosis not present

## 2021-04-22 DIAGNOSIS — M47816 Spondylosis without myelopathy or radiculopathy, lumbar region: Secondary | ICD-10-CM | POA: Diagnosis not present

## 2021-04-22 DIAGNOSIS — M48062 Spinal stenosis, lumbar region with neurogenic claudication: Secondary | ICD-10-CM | POA: Diagnosis not present

## 2021-05-02 DIAGNOSIS — M48062 Spinal stenosis, lumbar region with neurogenic claudication: Secondary | ICD-10-CM | POA: Diagnosis not present

## 2021-05-20 DIAGNOSIS — M48062 Spinal stenosis, lumbar region with neurogenic claudication: Secondary | ICD-10-CM | POA: Diagnosis not present

## 2021-05-20 DIAGNOSIS — M47816 Spondylosis without myelopathy or radiculopathy, lumbar region: Secondary | ICD-10-CM | POA: Diagnosis not present

## 2021-06-22 DIAGNOSIS — I69398 Other sequelae of cerebral infarction: Secondary | ICD-10-CM | POA: Diagnosis not present

## 2021-06-22 DIAGNOSIS — G890 Central pain syndrome: Secondary | ICD-10-CM | POA: Diagnosis not present

## 2021-06-22 DIAGNOSIS — M48062 Spinal stenosis, lumbar region with neurogenic claudication: Secondary | ICD-10-CM | POA: Diagnosis not present

## 2021-07-24 ENCOUNTER — Emergency Department (HOSPITAL_BASED_OUTPATIENT_CLINIC_OR_DEPARTMENT_OTHER): Payer: BC Managed Care – PPO

## 2021-07-24 ENCOUNTER — Telehealth: Payer: Self-pay | Admitting: Student in an Organized Health Care Education/Training Program

## 2021-07-24 ENCOUNTER — Encounter (HOSPITAL_BASED_OUTPATIENT_CLINIC_OR_DEPARTMENT_OTHER): Payer: Self-pay | Admitting: Urology

## 2021-07-24 ENCOUNTER — Inpatient Hospital Stay (HOSPITAL_BASED_OUTPATIENT_CLINIC_OR_DEPARTMENT_OTHER)
Admission: EM | Admit: 2021-07-24 | Discharge: 2021-07-26 | DRG: 247 | Disposition: A | Discharge status: Home / Self Care | Payer: BC Managed Care – PPO | Attending: Cardiovascular Disease | Admitting: Cardiovascular Disease

## 2021-07-24 ENCOUNTER — Other Ambulatory Visit: Payer: Self-pay

## 2021-07-24 ENCOUNTER — Inpatient Hospital Stay (HOSPITAL_COMMUNITY): Payer: BC Managed Care – PPO

## 2021-07-24 DIAGNOSIS — N400 Enlarged prostate without lower urinary tract symptoms: Secondary | ICD-10-CM | POA: Diagnosis present

## 2021-07-24 DIAGNOSIS — Z20822 Contact with and (suspected) exposure to covid-19: Secondary | ICD-10-CM | POA: Diagnosis not present

## 2021-07-24 DIAGNOSIS — G890 Central pain syndrome: Secondary | ICD-10-CM | POA: Diagnosis not present

## 2021-07-24 DIAGNOSIS — I25110 Atherosclerotic heart disease of native coronary artery with unstable angina pectoris: Secondary | ICD-10-CM | POA: Diagnosis not present

## 2021-07-24 DIAGNOSIS — Z888 Allergy status to other drugs, medicaments and biological substances status: Secondary | ICD-10-CM | POA: Diagnosis not present

## 2021-07-24 DIAGNOSIS — R079 Chest pain, unspecified: Secondary | ICD-10-CM | POA: Diagnosis not present

## 2021-07-24 DIAGNOSIS — I2584 Coronary atherosclerosis due to calcified coronary lesion: Secondary | ICD-10-CM | POA: Diagnosis not present

## 2021-07-24 DIAGNOSIS — Z7982 Long term (current) use of aspirin: Secondary | ICD-10-CM | POA: Diagnosis not present

## 2021-07-24 DIAGNOSIS — R001 Bradycardia, unspecified: Secondary | ICD-10-CM | POA: Diagnosis present

## 2021-07-24 DIAGNOSIS — I214 Non-ST elevation (NSTEMI) myocardial infarction: Secondary | ICD-10-CM

## 2021-07-24 DIAGNOSIS — Z79899 Other long term (current) drug therapy: Secondary | ICD-10-CM | POA: Diagnosis not present

## 2021-07-24 DIAGNOSIS — Z87891 Personal history of nicotine dependence: Secondary | ICD-10-CM | POA: Diagnosis not present

## 2021-07-24 DIAGNOSIS — Z955 Presence of coronary angioplasty implant and graft: Secondary | ICD-10-CM

## 2021-07-24 DIAGNOSIS — E785 Hyperlipidemia, unspecified: Secondary | ICD-10-CM | POA: Diagnosis not present

## 2021-07-24 DIAGNOSIS — I252 Old myocardial infarction: Secondary | ICD-10-CM | POA: Diagnosis present

## 2021-07-24 DIAGNOSIS — I69351 Hemiplegia and hemiparesis following cerebral infarction affecting right dominant side: Secondary | ICD-10-CM | POA: Diagnosis not present

## 2021-07-24 DIAGNOSIS — E782 Mixed hyperlipidemia: Secondary | ICD-10-CM | POA: Diagnosis present

## 2021-07-24 LAB — CBC WITH DIFFERENTIAL/PLATELET
Abs Immature Granulocytes: 0.01 10*3/uL (ref 0.00–0.07)
Basophils Absolute: 0 10*3/uL (ref 0.0–0.1)
Basophils Relative: 1 %
Eosinophils Absolute: 0.2 10*3/uL (ref 0.0–0.5)
Eosinophils Relative: 6 %
HCT: 38.9 % — ABNORMAL LOW (ref 39.0–52.0)
Hemoglobin: 13.3 g/dL (ref 13.0–17.0)
Immature Granulocytes: 0 %
Lymphocytes Relative: 50 %
Lymphs Abs: 2 10*3/uL (ref 0.7–4.0)
MCH: 34.5 pg — ABNORMAL HIGH (ref 26.0–34.0)
MCHC: 34.2 g/dL (ref 30.0–36.0)
MCV: 101 fL — ABNORMAL HIGH (ref 80.0–100.0)
Monocytes Absolute: 0.5 10*3/uL (ref 0.1–1.0)
Monocytes Relative: 13 %
Neutro Abs: 1.2 10*3/uL — ABNORMAL LOW (ref 1.7–7.7)
Neutrophils Relative %: 30 %
Platelets: 220 10*3/uL (ref 150–400)
RBC: 3.85 MIL/uL — ABNORMAL LOW (ref 4.22–5.81)
RDW: 13.7 % (ref 11.5–15.5)
WBC: 4 10*3/uL (ref 4.0–10.5)
nRBC: 0 % (ref 0.0–0.2)

## 2021-07-24 LAB — CBC
HCT: 34.4 % — ABNORMAL LOW (ref 39.0–52.0)
Hemoglobin: 11.6 g/dL — ABNORMAL LOW (ref 13.0–17.0)
MCH: 34 pg (ref 26.0–34.0)
MCHC: 33.7 g/dL (ref 30.0–36.0)
MCV: 100.9 fL — ABNORMAL HIGH (ref 80.0–100.0)
Platelets: 189 10*3/uL (ref 150–400)
RBC: 3.41 MIL/uL — ABNORMAL LOW (ref 4.22–5.81)
RDW: 13.5 % (ref 11.5–15.5)
WBC: 3.4 10*3/uL — ABNORMAL LOW (ref 4.0–10.5)
nRBC: 0 % (ref 0.0–0.2)

## 2021-07-24 LAB — ECHOCARDIOGRAM COMPLETE
Area-P 1/2: 3.12 cm2
Height: 70 in
S' Lateral: 2.9 cm
Weight: 3076.8 oz

## 2021-07-24 LAB — BASIC METABOLIC PANEL
Anion gap: 10 (ref 5–15)
BUN: 11 mg/dL (ref 8–23)
CO2: 27 mmol/L (ref 22–32)
Calcium: 9.5 mg/dL (ref 8.9–10.3)
Chloride: 98 mmol/L (ref 98–111)
Creatinine, Ser: 1.21 mg/dL (ref 0.61–1.24)
GFR, Estimated: >60 mL/min (ref >60)
Glucose, Bld: 124 mg/dL — ABNORMAL HIGH (ref 70–99)
Potassium: 4.4 mmol/L (ref 3.5–5.1)
Sodium: 135 mmol/L (ref 135–145)

## 2021-07-24 LAB — LIPID PANEL
Cholesterol: 220 mg/dL — ABNORMAL HIGH (ref 0–200)
HDL: 36 mg/dL — ABNORMAL LOW (ref >40)
LDL Cholesterol: UNDETERMINED mg/dL (ref 0–99)
Total CHOL/HDL Ratio: 6.1 RATIO
Triglycerides: 463 mg/dL — ABNORMAL HIGH (ref <150)
VLDL: UNDETERMINED mg/dL (ref 0–40)

## 2021-07-24 LAB — RESP PANEL BY RT-PCR (FLU A&B, COVID) ARPGX2
Influenza A by PCR: NEGATIVE
Influenza B by PCR: NEGATIVE
SARS Coronavirus 2 by RT PCR: NEGATIVE

## 2021-07-24 LAB — HEPARIN LEVEL (UNFRACTIONATED)
Heparin Unfractionated: 0.35 IU/mL (ref 0.30–0.70)
Heparin Unfractionated: 0.22 IU/mL — ABNORMAL LOW (ref 0.30–0.70)
Heparin Unfractionated: 0.14 IU/mL — ABNORMAL LOW (ref 0.30–0.70)

## 2021-07-24 LAB — TSH: TSH: 2.271 u[IU]/mL (ref 0.350–4.500)

## 2021-07-24 LAB — LDL CHOLESTEROL, DIRECT: Direct LDL: 144.6 mg/dL — ABNORMAL HIGH (ref 0–99)

## 2021-07-24 LAB — TROPONIN I (HIGH SENSITIVITY)
Troponin I (High Sensitivity): 656 ng/L (ref <18)
Troponin I (High Sensitivity): 577 ng/L (ref <18)

## 2021-07-24 LAB — HEMOGLOBIN A1C
Hgb A1c MFr Bld: 5.3 % (ref 4.8–5.6)
Mean Plasma Glucose: 105.41 mg/dL

## 2021-07-24 MED ORDER — SODIUM CHLORIDE 0.9% FLUSH: 3.0000 mL | Freq: Two times a day (BID) | INTRAVENOUS | Status: DC

## 2021-07-24 MED ORDER — HEPARIN BOLUS VIA INFUSION
2000.0000 [IU] | Freq: Once | INTRAVENOUS | Status: AC
  Administered 2021-07-24: 2000 [IU] via INTRAVENOUS
  Filled 2021-07-24: qty 2000

## 2021-07-24 MED ORDER — ACETAMINOPHEN 325 MG PO TABS: 650.0000 mg | ORAL_TABLET | ORAL | Status: DC | PRN

## 2021-07-24 MED ORDER — SODIUM CHLORIDE 0.9 % WEIGHT BASED INFUSION
3.0000 mL/kg/h | INTRAVENOUS | Status: AC
  Administered 2021-07-25: 3 mL/kg/h via INTRAVENOUS

## 2021-07-24 MED ORDER — SODIUM CHLORIDE 0.9 % WEIGHT BASED INFUSION
1.0000 mL/kg/h | INTRAVENOUS | Status: DC
  Administered 2021-07-25: 1 mL/kg/h via INTRAVENOUS

## 2021-07-24 MED ORDER — ATORVASTATIN CALCIUM 80 MG PO TABS
80.0000 mg | ORAL_TABLET | Freq: Every day | ORAL | Status: DC
  Administered 2021-07-24 – 2021-07-26 (×3): 80 mg via ORAL
  Filled 2021-07-24 (×2): qty 1
  Filled 2021-07-24: qty 2

## 2021-07-24 MED ORDER — NITROGLYCERIN 0.4 MG SL SUBL
0.4000 mg | SUBLINGUAL_TABLET | SUBLINGUAL | Status: DC | PRN
  Administered 2021-07-25 (×3): 0.4 mg via SUBLINGUAL
  Filled 2021-07-24: qty 1

## 2021-07-24 MED ORDER — HEPARIN BOLUS VIA INFUSION
4000.0000 [IU] | Freq: Once | INTRAVENOUS | Status: AC
  Administered 2021-07-24: 4000 [IU] via INTRAVENOUS

## 2021-07-24 MED ORDER — CARVEDILOL 3.125 MG PO TABS
3.1250 mg | ORAL_TABLET | Freq: Two times a day (BID) | ORAL | Status: DC
  Administered 2021-07-24 – 2021-07-26 (×4): 3.125 mg via ORAL
  Filled 2021-07-24 (×4): qty 1

## 2021-07-24 MED ORDER — ASPIRIN 81 MG PO CHEW
324.0000 mg | CHEWABLE_TABLET | Freq: Once | ORAL | Status: AC
  Administered 2021-07-24: 324 mg via ORAL
  Filled 2021-07-24: qty 4

## 2021-07-24 MED ORDER — HEPARIN (PORCINE) 25000 UT/250ML-% IV SOLN
1350.0000 [IU]/h | INTRAVENOUS | Status: DC
  Administered 2021-07-24: 1100 [IU]/h via INTRAVENOUS
  Administered 2021-07-24: 1350 [IU]/h via INTRAVENOUS
  Filled 2021-07-24 (×3): qty 250

## 2021-07-24 MED ORDER — ONDANSETRON HCL 4 MG/2ML IJ SOLN: 4.0000 mg | Freq: Four times a day (QID) | INTRAMUSCULAR | Status: DC | PRN

## 2021-07-24 MED ORDER — LACOSAMIDE 50 MG PO TABS
50.0000 mg | ORAL_TABLET | Freq: Two times a day (BID) | ORAL | Status: DC
  Administered 2021-07-24 – 2021-07-26 (×4): 50 mg via ORAL
  Filled 2021-07-24 (×5): qty 1

## 2021-07-24 MED ORDER — SODIUM CHLORIDE 0.9 % IV SOLN: 250.0000 mL | INTRAVENOUS | Status: DC | PRN

## 2021-07-24 MED ORDER — ASPIRIN 81 MG PO CHEW
81.0000 mg | CHEWABLE_TABLET | Freq: Every day | ORAL | Status: DC
  Administered 2021-07-24 – 2021-07-26 (×3): 81 mg via ORAL
  Filled 2021-07-24 (×3): qty 1

## 2021-07-24 MED ORDER — LACOSAMIDE 100 MG PO TABS: 0.5000 | ORAL_TABLET | Freq: Two times a day (BID) | ORAL | Status: DC

## 2021-07-24 MED ORDER — DIVALPROEX SODIUM ER 500 MG PO TB24
1000.0000 mg | ORAL_TABLET | Freq: Every day | ORAL | Status: DC
  Administered 2021-07-24 – 2021-07-26 (×3): 1000 mg via ORAL
  Filled 2021-07-24 (×3): qty 2

## 2021-07-24 MED ORDER — SODIUM CHLORIDE 0.9% FLUSH
3.0000 mL | INTRAVENOUS | Status: DC | PRN
Start: 2021-07-24 — End: 2021-07-25

## 2021-07-24 NOTE — Progress Notes (Signed)
Pt arrived to 4e from [facility]. Pt oriented to room and staff. Telemetry box applied and CCMD notified x2 verifiers. Vitals obtained. Cardiology notified.

## 2021-07-24 NOTE — Telephone Encounter (Signed)
Paged by [facility] regarding patient at [facility] with NSTEMI.  Dr. Manus Gunning.   54M prior CVA, HLD, prior tobacco use with L "bicep" pain that occurred when he laid down at bed tonight and lasted 30-45 minutes before improvement. Similar episode 2 weeks ago that resolved on its own after several hours. He took [employer]. No radiation down his arm and no associated CP/pressure or SOB. His wife reported that he was diaphoretic 2 weeks ago but not tonight. No prior cardiac hx.   ECG with new TWI in inferior leads but no STEMI criteria.   TTE Result date: 05/09/19  1. The left ventricle has hyperdynamic systolic function, with an  ejection fraction of >65%. The cavity size was normal. Left ventricular  diastolic Doppler parameters are consistent with impaired relaxation. No  evidence of left ventricular regional wall   motion abnormalities.   2. The right ventricle has normal systolic function. The cavity was  normal. There is no increase in right ventricular wall thickness. Right  ventricular systolic pressure could not be assessed.   3. The aortic valve is tricuspid. Mild sclerosis of the aortic valve.   4. The aorta is normal in size and structure.   ED giving ASA/heparin and will plan for transfer to [facility] for additional cardiac eval with elevated hsT (656) and new TWI compared to prior (not present on 05/09/19). He is arm pain free currently without nitrates or pain meds. Has prior stroke (05/09/19) with R sided numbness but otherwise no active medical issues. Plan for transfer to cardiology service.

## 2021-07-24 NOTE — ED Provider Notes (Signed)
[facility] EMERGENCY DEPARTMENT Provider Note   CSN: [id number] Arrival date & time: 07/24/21  0006     History Chief Complaint  Patient presents with   Arm Pain    Peter Dominguez is a 65 y.o. male.  Patient with a history of previous stroke with right-sided deficits here with "bicep pain".  States the pain occurred when he laid down in bed tonight and his left upper arm has lasted about 30 to 45 minutes and a starting to improve.  He had a similar episode of pain about 2 weeks ago that resolved on its own after about several hours.  He took aspirin and Aleve for the pain tonight.  Denies any radiation of the pain down his arm.  No associated weakness, numbness or tingling.  No chest pain or shortness of breath.  His wife states he was sweaty with the pain 2 weeks ago but not tonight.  He denies any cardiac history.  He denies any weakness or numbness in the arm.  His right-sided numbness is unchanged.  No headache or neck pain. No fall or trauma. Patient is gradually improving and is almost resolved.  Never did have chest pain, neck pain or upper back pain.  The history is provided by the patient and the spouse.  Arm Pain Pertinent negatives include no chest pain, no abdominal pain, no headaches and no shortness of breath.      Past Medical History:  Diagnosis Date   Stroke [facility])     Patient Active Problem List   Diagnosis Date Noted   Mediastinal lymphadenopathy 05/09/2019   Hyperlipidemia 05/09/2019   Acute CVA (cerebrovascular accident) (HCC) 05/08/2019    History reviewed. No pertinent surgical history.     History reviewed. No pertinent family history.  Social History   Tobacco Use   Smoking status: Former    Types: Cigarettes    Quit date: 05/13/2019    Years since quitting: 2.2   Smokeless tobacco: Never  Substance Use Topics   Alcohol use: Yes    Alcohol/week: 1.0 standard drink    Types: 1 Shots of liquor per week    Comment: social    Drug  use: No    Home Medications Prior to Admission medications   Medication Sig Start Date End Date Taking? Authorizing Provider  amitriptyline (ELAVIL) 10 MG tablet TAKE 1 TABLET BY MOUTH EVERYDAY AT BEDTIME 03/30/20   York Spaniel, MD  aspirin EC 81 MG EC tablet Take 1 tablet (81 mg total) by mouth daily. 05/10/19   Narda Bonds, MD  Melatonin 10 MG TABS Take by mouth at bedtime.    [provider]  Omega-3 1000 MG CAPS Take 1,000 mg by mouth daily.    [provider]  rosuvastatin (CRESTOR) 10 MG tablet TAKE 1 TABLET BY MOUTH EVERYDAY AT BEDTIME 05/25/20   Ihor Austin, NP  topiramate (TOPAMAX) 25 MG tablet Take 1 tablet (25 mg total) by mouth 3 (three) times daily. Take 2 tablets 25mg  by mouth 3 times daily 10/27/19   12/25/19, MD    Allergies    Bupropion  Review of Systems   Review of Systems  Constitutional:  Negative for activity change, appetite change and fever.  HENT:  Negative for congestion.   Respiratory:  Negative for cough, chest tightness and shortness of breath.   Cardiovascular:  Negative for chest pain.  Gastrointestinal:  Negative for abdominal pain, nausea and vomiting.  Genitourinary:  Negative for dysuria  and hematuria.  Musculoskeletal:  Positive for arthralgias and myalgias.  Skin:  Negative for rash.  Neurological:  Negative for dizziness, weakness and headaches.   all other systems are negative except as noted in the HPI and PMH.   Physical Exam Updated Vital Signs BP 126/79 (BP Location: Right Arm)   Pulse 77   Resp 18   Ht 5\' 10"  (1.778 m)   Wt 92.3 kg   SpO2 99%   BMI 29.20 kg/m   Physical Exam Vitals and nursing note reviewed.  Constitutional:      General: He is not in acute distress.    Appearance: He is well-developed.  HENT:     Head: Normocephalic and atraumatic.     Mouth/Throat:     Pharynx: No oropharyngeal exudate.  Eyes:     Conjunctiva/sclera: Conjunctivae normal.     Pupils: Pupils are equal,  round, and reactive to light.  Neck:     Comments: No meningismus. Cardiovascular:     Rate and Rhythm: Normal rate and regular rhythm.     Heart sounds: Normal heart sounds. No murmur heard. Pulmonary:     Effort: Pulmonary effort is normal. No respiratory distress.     Breath sounds: Normal breath sounds.  Abdominal:     Palpations: Abdomen is soft.     Tenderness: There is no abdominal tenderness. There is no guarding or rebound.  Musculoskeletal:        General: No tenderness. Normal range of motion.     Cervical back: Normal range of motion and neck supple.     Comments: Left upper arm appears normal to inspection.  No tenderness to palpation.  Cardinal hand movements intact, radial pulse intact.  Skin:    General: Skin is warm.  Neurological:     Mental Status: He is alert and oriented to person, place, and time.     Cranial Nerves: No cranial nerve deficit.     Motor: No abnormal muscle tone.     Coordination: Coordination normal.     Comments: No ataxia on finger to nose bilaterally. No pronator drift. 5/5 strength throughout. CN 2-12 intact.Equal grip strength. Sensation intact.   Psychiatric:        Behavior: Behavior normal.    ED Results / Procedures / Treatments   Labs (all labs ordered are listed, but only abnormal results are displayed) Labs Reviewed  CBC WITH DIFFERENTIAL/PLATELET - Abnormal; Notable for the following components:      Result Value   RBC 3.85 (*)    HCT 38.9 (*)    MCV 101.0 (*)    MCH 34.5 (*)    Neutro Abs 1.2 (*)    All other components within normal limits  BASIC METABOLIC PANEL - Abnormal; Notable for the following components:   Glucose, Bld 124 (*)    All other components within normal limits  TROPONIN I (HIGH SENSITIVITY) - Abnormal; Notable for the following components:   Troponin I (High Sensitivity) 656 (*)    All other components within normal limits  HEPARIN LEVEL (UNFRACTIONATED)  TROPONIN I (HIGH SENSITIVITY)     EKG EKG Interpretation  Date/Time:  Sunday July 24 2021 00:19:11 EDT Ventricular Rate:  78 PR Interval:  147 QRS Duration: 87 QT Interval:  354 QTC Calculation: 404 R Axis:   66 Text Interpretation: Sinus rhythm Abnormal T, consider ischemia, inferior leads new T wave inversions Confirmed by 07-02-1997 [phone]) on 07/24/2021 12:23:12 AM  Radiology DG Chest 2 View  Result Date: 07/24/2021 CLINICAL DATA:  Chest pain.  Left biceps pain for 2 weeks. EXAM: CHEST - 2 VIEW COMPARISON:  CT 06/02/2019 FINDINGS: The heart size and mediastinal contours are within normal limits. Both lungs are clear. The visualized skeletal structures are unremarkable. IMPRESSION: No active cardiopulmonary disease. Electronically Signed   By: Burman Nieves M.D.   On: 07/24/2021 02:00    Procedures .Critical Care Performed by: Glynn Octave, MD Authorized by: Glynn Octave, MD   Critical care provider statement:    Critical care time (minutes):  45   Critical care was necessary to treat or prevent imminent or life-threatening deterioration of the following conditions: ACS.   Critical care was time spent personally by me on the following activities:  Discussions with consultants, evaluation of patient's response to treatment, examination of patient, ordering and performing treatments and interventions, ordering and review of laboratory studies, ordering and review of radiographic studies, pulse oximetry, re-evaluation of patient's condition, obtaining history from patient or surrogate and review of old charts   Medications Ordered in ED Medications  aspirin chewable tablet 324 mg (has no administration in time range)    ED Course  I have reviewed the triage vital signs and the nursing notes.  Pertinent labs & imaging results that were available during my care of the patient were reviewed by me and considered in my medical decision making (see chart for details).    MDM  Rules/Calculators/A&P                          Left upper arm pain now improving.  Neurovascularly intact.  EKG shows new T wave inversions inferiorly.  Patient denies having chest pain.  Did have similar episode 2 weeks ago that resolved on its own.  Arm is neurovascularly intact.  There is concern for cardiac etiology as this pain given his EKG changes.  Aspirin given.  Troponin 656.  Will initiate IV heparin.  Remains chest pain and arm pain-free after aspirin.  Discussed with cardiology Dr. Cherly Beach.  He agrees with admission for likely catheterization tomorrow.  Continue heparin.  Low suspicion for pulmonary embolism or aortic dissection.  Dr. Cherly Beach reviewed EKG and agrees no acute ST elevation. Treat as NSTEMI with catheterization tomorrow. Arrange admission for [facility]. Final Clinical Impression(s) / ED Diagnoses Final diagnoses:  NSTEMI (non-ST elevated myocardial infarction) [facility])    Rx / DC Orders ED Discharge Orders     None        Iness Pangilinan, Jeannett Senior, MD 07/24/21 [phone]

## 2021-07-24 NOTE — H&P (Addendum)
Cardiology Admission History and Physical:   Patient ID: Dashiel Bergquist MRN: [id number]; DOB: [birth year]   Admission date: 07/24/2021  PCP:  Katherine Basset, PA-C   [facility] Providers Cardiologist:  New (Dr. Royann Shivers)  Chief Complaint: Left Arm Pain  Patient Profile:   Uriel Dowding is a 65 y.o. male with a history of prior stroke in 04/2019 with residual right sided paresthesias, hyperlipidemia, BPH, and tobacco abuse who presented with left arm pain and was found to have a NSTEMI.  History of Present Illness:   Mr. Prehn is a 65 year old male with the above history. No prior cardiac history.  He has a history of tobacco use and recently quit smoking about 2 to 3 months ago.  Prior to this he had been smoking around 1 pack/day since he was 65 years old.  He has no known family history of heart disease or stroke.  Patient [facility] to [facility] ED shortly after midnight on 07/24/2021 with reports of left arm pain.  Patient reported that over the last 2 weeks he has had 2 episodes of left arm pain at night when he lays down to go to bed.  He describes the pain as a numbness and feeling like his arm is asleep.  The first episode occurred about 2 weeks ago and was associated with diaphoresis.  The second episode occurred last night which prompted him to come to the ED.  He denies any arm pain with exertion.  No chest pain, shortness of breath, orthopnea, PND, lower extremity edema, palpitations, lightheadedness, dizziness, syncope.  He denies any recent fevers or illnesses.  He does have a history of allergies so occasionally has some nasal congestion and eye drainage but this is not new.  No GI symptoms.  No abnormal bleeding in urine or stools.  In the ED, vital stable.  EKG showed normal sinus rhythm with T waves in inferior leads which are new compared to prior tracing in 04/2019.  High-sensitivity troponin elevated at 656 >> 577.  Chest x-ray showed no acute findings.  WBC 4.0, Hgb 13.3, Plts 220. Na 135, K 4.4, Glucose 124, BUN 11, Cr 1.21.  He was started on IV Heparin and given aspirin 324 mg.  He was admitted at [facility] for further cardiac work-up.  I examined patient after he arrived to [facility].  He is currently chest pain-free and is resting comfortably no acute distress.  Past Medical History:  Diagnosis Date   Stroke [facility])     History reviewed. No pertinent surgical history.   Medications Prior to Admission: Prior to Admission medications   Medication Sig Start Date End Date Taking? Authorizing Provider  amitriptyline (ELAVIL) 10 MG tablet TAKE 1 TABLET BY MOUTH EVERYDAY AT BEDTIME 03/30/20  Yes York Spaniel, MD  aspirin EC 81 MG EC tablet Take 1 tablet (81 mg total) by mouth daily. 05/10/19  Yes Narda Bonds, MD  Omega-3 1000 MG CAPS Take 1,000 mg by mouth daily.   Yes [provider]  rosuvastatin (CRESTOR) 10 MG tablet TAKE 1 TABLET BY MOUTH EVERYDAY AT BEDTIME 05/25/20  Yes McCue, Jessica, NP  Melatonin 10 MG TABS Take by mouth at bedtime.    [provider]  topiramate (TOPAMAX) 25 MG tablet Take 1 tablet (25 mg total) by mouth 3 (three) times daily. Take 2 tablets 25mg  by mouth 3 times daily 10/27/19   12/25/19, MD     Allergies:  Allergies  Allergen Reactions   Bupropion Swelling    Social History:   Social History   Socioeconomic History   Marital status: Married    Spouse name: Not on file   Number of children: Not on file   Years of education: Not on file   Highest education level: Not on file  Occupational History   Not on file  Tobacco Use   Smoking status: Former    Types: Cigarettes    Quit date: 05/13/2019    Years since quitting: 2.2   Smokeless tobacco: Never  Substance and Sexual Activity   Alcohol use: Yes    Alcohol/week: 1.0 standard drink    Types: 1 Shots of liquor per week    Comment: social    Drug use: No   Sexual activity: Not on file  Other Topics Concern    Not on file  Social History Narrative   Not on file   Social Determinants of Health   Financial Resource Strain: Not on file  Food Insecurity: Not on file  Transportation Needs: Not on file  Physical Activity: Not on file  Stress: Not on file  Social Connections: Not on file  Intimate Partner Violence: Not on file    Family History:   The patient's family history is negative for Heart disease and Stroke.    ROS:  Please see the history of present illness.  Review of Systems  Constitutional:  Positive for diaphoresis. Negative for chills and fever.  HENT:  Positive for congestion.   Eyes:  Positive for discharge (occasional watery eye from allergies).  Respiratory:  Negative for cough and shortness of breath.   Cardiovascular:  Negative for chest pain, palpitations, orthopnea, leg swelling and PND.  Gastrointestinal:  Negative for blood in stool, melena, nausea and vomiting.  Genitourinary:  Negative for dysuria.  Musculoskeletal:        Left arm pain  Neurological:  Negative for dizziness and loss of consciousness.       Residual right sided paresthesia from prior stroke  Endo/Heme/Allergies:  Does not bruise/bleed easily.  Psychiatric/Behavioral:  Positive for substance abuse (recently quit smoking about 2-3 month ago).     Physical Exam/Data:   Vitals:   07/24/21 1000 07/24/21 1015 07/24/21 1030 07/24/21 1226  BP: 105/63 103/62 104/70 (!) 113/40  Pulse: 72 63 62 60  Resp: (!) 25 (!) 25 (!) 23 20  Temp:    97.8 F (36.6 C)  TempSrc:    Oral  SpO2: 98% 97% 97% 98%  Weight:    87.2 kg  Height:    5\' 10"  (1.778 m)   No intake or output data in the 24 hours ending 07/24/21 1309 Last 3 Weights 07/24/2021 07/24/2021 12/03/2019  Weight (lbs) 192 lb 4.8 oz 203 lb 7.8 oz 203 lb 6.4 oz  Weight (kg) 87.227 kg 92.3 kg 92.262 kg     Body mass index is 27.59 kg/m.  General: 65 y.o. male resting comfortably in no acute distress. HEENT: Normocephalic and atraumatic. Sclera  clear. EOMs intact. Neck: Supple. No JVD. Heart: RRR. Distinct S1 and S2. No murmurs, gallops, or rubs. Radial and distal pedal pulses 2+ and equal bilaterally. Lungs: No increased work of breathing. Clear to ausculation bilaterally. No wheezes, rhonchi, or rales.  Abdomen: Soft, non-distended, and non-tender to palpation.  MSK: Normal strength and tone for age. Extremities: No lower extremity edema.    Skin: Warm and dry. Neuro: Alert and oriented x3. No focal deficits. Residual  right sided paresthesia from prior stroke. Psych: Normal affect. Responds appropriately.  EKG:  The ECG that was done was personally reviewed and demonstrates: Normal sinus rhythm, rate 78 bpm, with new T wave inversions in inferior.  Relevant CV Studies:  Echocardiogram 05/09/2019: Impressions:  1. The left ventricle has hyperdynamic systolic function, with an  ejection fraction of >65%. The cavity size was normal. Left ventricular  diastolic Doppler parameters are consistent with impaired relaxation. No  evidence of left ventricular regional wall   motion abnormalities.   2. The right ventricle has normal systolic function. The cavity was  normal. There is no increase in right ventricular wall thickness. Right  ventricular systolic pressure could not be assessed.   3. The aortic valve is tricuspid. Mild sclerosis of the aortic valve.   4. The aorta is normal in size and structure.   Laboratory Data:  High Sensitivity Troponin:   Recent Labs  Lab 07/24/21 0030 07/24/21 0233  TROPONINIHS 656* 577*      Chemistry Recent Labs  Lab 07/24/21 0030  NA 135  K 4.4  CL 98  CO2 27  GLUCOSE 124*  BUN 11  CREATININE 1.21  CALCIUM 9.5  GFRNONAA >60  ANIONGAP 10    No results for input(s): PROT, ALBUMIN, AST, ALT, ALKPHOS, BILITOT in the last 168 hours. Lipids No results for input(s): CHOL, TRIG, HDL, LABVLDL, LDLCALC, CHOLHDL in the last 168 hours. Hematology Recent Labs  Lab 07/24/21 0030  WBC  4.0  RBC 3.85*  HGB 13.3  HCT 38.9*  MCV 101.0*  MCH 34.5*  MCHC 34.2  RDW 13.7  PLT 220   Thyroid No results for input(s): TSH, FREET4 in the last 168 hours. BNPNo results for input(s): BNP, PROBNP in the last 168 hours.  DDimer No results for input(s): DDIMER in the last 168 hours.   Radiology/Studies:  DG Chest 2 View  Result Date: 07/24/2021 CLINICAL DATA:  Chest pain.  Left biceps pain for 2 weeks. EXAM: CHEST - 2 VIEW COMPARISON:  CT 06/02/2019 FINDINGS: The heart size and mediastinal contours are within normal limits. Both lungs are clear. The visualized skeletal structures are unremarkable. IMPRESSION: No active cardiopulmonary disease. Electronically Signed   By: Burman Nieves M.D.   On: 07/24/2021 02:00     Assessment and Plan:   NSTEMI - Patient presented with 2 episodes of left arm pain at rest over the last 2 weeks. - EKG showed new T wave inversions in inferior leads.  - High-sensitivity troponin peaked at 656. - No current arm or chest pain. - Will check Echo. - Continue IV Heparin. - Continue Aspirin 81mg  daily.  - Started on Coreg 3.125mg  twice daily and Lipitor 80mg  in the ED. Will continue. - Plan is for cardiac catheterization on 07/25/2021. The patient understands that risks include but are not limited to stroke (1 in 1000), death (1 in 1000), kidney failure [usually temporary] (1 in 500), bleeding (1 in 200), allergic reaction [possibly serious] (1 in 200), and agrees to proceed.   Hyperlipidemia - LDL 148 in 03/2021. Repeat lipid panel pending.  - LDL goal <70 given prior stroke and likely CAD. - Patient on Crestor 10mg  daily at home. Started on Lipitor 80mg  daily here. - Will need repeat lipid panel and LFTs in 2 months.  Prior Stroke - History of stroke in 04/2019 with residual right sided paresthesia. - Continue aspirin and statin as above.  Risk Assessment/Risk Scores:   TIMI Risk Score for Unstable Angina  or Non-ST Elevation MI:   The  patient's TIMI risk score is 5, which indicates a 26% risk of all cause mortality, new or recurrent myocardial infarction or need for urgent revascularization in the next 14 days.  Severity of Illness: The appropriate patient status for this patient is OBSERVATION. Observation status is judged to be reasonable and necessary in order to provide the required intensity of service to ensure the patient's safety. The patient's presenting symptoms, physical exam findings, and initial radiographic and laboratory data in the context of their medical condition is felt to place them at decreased risk for further clinical deterioration. Furthermore, it is anticipated that the patient will be medically stable for discharge from the hospital within 2 midnights of admission. The following factors support the patient status of observation.   " The patient's presenting symptoms include left arm pain. " The physical exam findings as above. " The initial radiographic and laboratory data significant for elevated troponin.  For questions or updates, please contact [facility] Please consult [email or web address] for contact info under     Signed, Corrin Parker, PA-C  07/24/2021 1:09 PM   I have seen and examined the patient along with Corrin Parker, PA-C.  I have reviewed the chart, notes and new data.  I agree with PA/NP's note.  Key new complaints: His initial episode of left upper arm pain (which appears to be his anginal pattern) occurred 2 weeks ago and associated with severe diaphoresis.  It recurred again just prior to this admission.  He has not had interval exertional angina or dyspnea. Key examination changes: Normal cardiovascular exam.  Appears very comfortable lying fully supine in bed. Key new findings / data: ECG shows findings suggestive of a completed inferior wall infarction with Q waves and subtle residual ST segment elevation in 2, 3, aVF, with inverted T waves consistent with an event that  occurred at least 24 hours ago (probably 2 weeks ago).  Neck enzymes have a decreasing pattern.  Lipid profile shows mixed hyperlipidemia with low HDL and markedly elevated total cholesterol and triglycerides.  PLAN: Suspect that Mr. Hardacre had an inferior wall myocardial infarction 2 weeks ago, but still has substantial residual viable tissue in the inferior myocardium, which explains his recurrent problems with unstable angina.  We will check an echocardiogram as soon as possible, but regardless of findings he should undergo cardiac catheterization with coronary angiography and possible percutaneous revascularization with angioplasty and stent tomorrow. This procedure has been fully reviewed with the patient and written informed consent has been obtained. He will require aggressive risk factor management, in particular high-dose statin therapy.  Start beta-blocker.  Aspirin and intravenous heparin.  So far no evidence of heart failure.  Thurmon Fair, MD, [facility] [phone] 07/24/2021, 1:37 PM

## 2021-07-24 NOTE — Progress Notes (Signed)
ANTICOAGULATION CONSULT NOTE - Initial Consult  Pharmacy Consult for Heparin Indication: chest pain/ACS  Allergies  Allergen Reactions   Bupropion Swelling    Patient Measurements: Height: 5\' 10"  (177.8 cm) Weight: 92.3 kg (203 lb 7.8 oz) IBW/kg (Calculated) : 73   Vital Signs: BP: 110/66 (10/09 0115) Pulse Rate: 79 (10/09 0115)  Labs: Recent Labs    07/24/21 0030  HGB 13.3  HCT 38.9*  PLT 220  CREATININE 1.21  TROPONINIHS 656*    Estimated Creatinine Clearance: 68.5 mL/min (by C-G formula based on SCr of 1.21 mg/dL).   Medical History: Past Medical History:  Diagnosis Date   Stroke [facility])     Medications:  No current facility-administered medications on file prior to encounter.   Current Outpatient Medications on File Prior to Encounter  Medication Sig Dispense Refill   amitriptyline (ELAVIL) 10 MG tablet TAKE 1 TABLET BY MOUTH EVERYDAY AT BEDTIME 90 tablet 0   aspirin EC 81 MG EC tablet Take 1 tablet (81 mg total) by mouth daily. 30 tablet 0   Omega-3 1000 MG CAPS Take 1,000 mg by mouth daily.     rosuvastatin (CRESTOR) 10 MG tablet TAKE 1 TABLET BY MOUTH EVERYDAY AT BEDTIME 90 tablet 3   Melatonin 10 MG TABS Take by mouth at bedtime.     topiramate (TOPAMAX) 25 MG tablet Take 1 tablet (25 mg total) by mouth 3 (three) times daily. Take 2 tablets 25mg  by mouth 3 times daily 180 tablet 3     Assessment: 65 y.o. male with arm pain, elevated troponin, possible ACS for heparin Goal of Therapy:  Heparin level 0.3-0.7 units/ml Monitor platelets by anticoagulation protocol: Yes   Plan:  Heparin 4000 units IV bolus, then start heparin 1100 units/hr Check heparin level in 6 hours.   07/24/2021,2:03 AM

## 2021-07-24 NOTE — Progress Notes (Signed)
  Echocardiogram 2D Echocardiogram has been performed.  [name] 07/24/2021, 3:37 PM

## 2021-07-24 NOTE — ED Notes (Signed)
Paperwork provided to [employer].

## 2021-07-24 NOTE — ED Notes (Signed)
Lab provided a blue and gold top for TSH and Heparin levels.

## 2021-07-24 NOTE — ED Provider Notes (Signed)
  Physical Exam  BP 119/77   Pulse 69   Resp (!) 21   Ht 5\' 10"  (1.778 m)   Wt 92.3 kg   SpO2 99%   BMI 29.20 kg/m   Physical Exam  ED Course/Procedures     Procedures  MDM  Received care from Dr. .  Briefly, this is a 65yo male who presented with concern for arm pain and was found to have ECG changes and torponin elevation consistent with NSTEMI. He has been given aspirin and on heparin gtt.   Unfortunately, we are still waiting for bed placement and the emergency department capacity at [facility] makes ED to ED transfer also difficult. Discussed management with Dr. [facility], who is the Cardiologist on call and expecting physician. Peter Dominguez is not having any chest pain or arm pain today, with tentative plan for catheterization tomorrow unless things change. At this time due to circumstances we feel it is appropriate to continue management here at [facility] as he awaits a bed at [facility], including continuing heparin gtt, starting atorvastatin, daily aspirin, BID carvedilol and notifying the team if condition changes.       [facility], MD 07/24/21 [phone]

## 2021-07-24 NOTE — Progress Notes (Signed)
ANTICOAGULATION CONSULT NOTE   Pharmacy Consult for Heparin Indication: chest pain/ACS  Allergies  Allergen Reactions   Bupropion Swelling    Patient Measurements: Height: 5\' 10"  (177.8 cm) Weight: 87.2 kg (192 lb 4.8 oz) IBW/kg (Calculated) : 73 HEPARIN DW (KG): 87.2    Vital Signs: Temp: 97.9 F (36.6 C) (10/09 1601) Temp Source: Oral (10/09 1601) BP: 106/63 (10/09 1601) Pulse Rate: 70 (10/09 1601)  Labs: Recent Labs    07/24/21 0030 07/24/21 0233 07/24/21 1114 07/24/21 1248 07/24/21 1801  HGB 13.3  --   --   --  11.6*  HCT 38.9*  --   --   --  34.4*  PLT 220  --   --   --  189  HEPARINUNFRC  --   --  0.35 0.22* 0.14*  CREATININE 1.21  --   --   --   --   TROPONINIHS 656* 577*  --   --   --      Estimated Creatinine Clearance: 62 mL/min (by C-G formula based on SCr of 1.21 mg/dL).   Medical History: Past Medical History:  Diagnosis Date   Stroke [facility])     Medications:  No current facility-administered medications on file prior to encounter.   Current Outpatient Medications on File Prior to Encounter  Medication Sig Dispense Refill   aspirin EC 81 MG EC tablet Take 1 tablet (81 mg total) by mouth daily. 30 tablet 0   clotrimazole-betamethasone (LOTRISONE) cream Apply 1 application topically 2 (two) times daily as needed for rash.     divalproex (DEPAKOTE ER) 500 MG 24 hr tablet Take 1,000 mg by mouth daily.     Lacosamide 100 MG TABS Take 0.5 tablets by mouth 2 (two) times daily.     [DISCONTINUED] Omega-3 1000 MG CAPS Take 1,000 mg by mouth daily.     amitriptyline (ELAVIL) 10 MG tablet TAKE 1 TABLET BY MOUTH EVERYDAY AT BEDTIME (Patient not taking: No sig reported) 90 tablet 0   rosuvastatin (CRESTOR) 10 MG tablet TAKE 1 TABLET BY MOUTH EVERYDAY AT BEDTIME (Patient not taking: No sig reported) 90 tablet 3   topiramate (TOPAMAX) 25 MG tablet Take 1 tablet (25 mg total) by mouth 3 (three) times daily. Take 2 tablets 25mg  by mouth 3 times daily (Patient  not taking: No sig reported) 180 tablet 3   [DISCONTINUED] Melatonin 10 MG TABS Take by mouth at bedtime.       Assessment: 65 y.o. male with arm pain and elevated troponin. Pharmacy consulted for heparin dosing in the setting of NSTEMI. Patient transferred from [facility] for higher level of care.  -heparin level down to 0.14  Goal of Therapy:  Heparin level 0.3-0.7 units/ml Monitor platelets by anticoagulation protocol: Yes   Plan:  -Heparin bolus 2000 units x1 and increase infusion to 1350 units/hr -Heparin level in 6 hours and daily wth CBC daily  71, PharmD Clinical Pharmacist **Pharmacist phone directory can now be found on [email or web address] (PW [email or web address]).  Listed under [facility].

## 2021-07-24 NOTE — Progress Notes (Signed)
ANTICOAGULATION CONSULT NOTE - Initial Consult  Pharmacy Consult for Heparin Indication: chest pain/ACS  Allergies  Allergen Reactions   Bupropion Swelling    Patient Measurements: Height: 5\' 10"  (177.8 cm) Weight: 87.2 kg (192 lb 4.8 oz) IBW/kg (Calculated) : 73   Vital Signs: Temp: 97.8 F (36.6 C) (10/09 1226) Temp Source: Oral (10/09 1226) BP: 113/40 (10/09 1226) Pulse Rate: 60 (10/09 1226)  Labs: Recent Labs    07/24/21 0030 07/24/21 0233 07/24/21 1114  HGB 13.3  --   --   HCT 38.9*  --   --   PLT 220  --   --   HEPARINUNFRC  --   --  0.35  CREATININE 1.21  --   --   TROPONINIHS 656* 577*  --      Estimated Creatinine Clearance: 62 mL/min (by C-G formula based on SCr of 1.21 mg/dL).   Medical History: Past Medical History:  Diagnosis Date   Stroke [facility]     Medications:  No current facility-administered medications on file prior to encounter.   Current Outpatient Medications on File Prior to Encounter  Medication Sig Dispense Refill   amitriptyline (ELAVIL) 10 MG tablet TAKE 1 TABLET BY MOUTH EVERYDAY AT BEDTIME 90 tablet 0   aspirin EC 81 MG EC tablet Take 1 tablet (81 mg total) by mouth daily. 30 tablet 0   Omega-3 1000 MG CAPS Take 1,000 mg by mouth daily.     rosuvastatin (CRESTOR) 10 MG tablet TAKE 1 TABLET BY MOUTH EVERYDAY AT BEDTIME 90 tablet 3   Melatonin 10 MG TABS Take by mouth at bedtime.     topiramate (TOPAMAX) 25 MG tablet Take 1 tablet (25 mg total) by mouth 3 (three) times daily. Take 2 tablets 25mg  by mouth 3 times daily 180 tablet 3     Assessment: 65 y.o. male with arm pain and elevated troponin. Pharmacy consulted for heparin dosing in the setting of NSTEMI. Patient transferred from [facility] for higher level of care. Heparin drip was not stopped in transit and heparin level is therapeutic at 0.35. Will continue at current rate and recheck in 6 hours with CBC. No reported bleeding.   Goal of Therapy:  Heparin level  0.3-0.7 units/ml Monitor platelets by anticoagulation protocol: Yes   Plan:  Continue Heparin 1100 units/hr Check heparin level in 6 hours.  Daily heparin level and CBC  Thank you for allowing pharmacy to participate in this patient's care.  71, PharmD PGY1 Pharmacy Resident 07/24/2021 1:31 PM Check [email or web address] for unit specific pharmacy number

## 2021-07-24 NOTE — ED Notes (Signed)
Phone Hand Off report given to [employer]

## 2021-07-24 NOTE — ED Triage Notes (Signed)
Pt states left bicep pain x 2 weeks, worse with sleeping, h/o stroke that affected right side. States "easing up now".

## 2021-07-25 ENCOUNTER — Encounter (HOSPITAL_COMMUNITY)
Admission: EM | Disposition: A | Discharge status: Home / Self Care | Payer: Self-pay | Source: Home / Self Care | Attending: Cardiovascular Disease

## 2021-07-25 ENCOUNTER — Encounter (HOSPITAL_COMMUNITY): Payer: Self-pay | Admitting: Cardiology

## 2021-07-25 DIAGNOSIS — I25110 Atherosclerotic heart disease of native coronary artery with unstable angina pectoris: Secondary | ICD-10-CM

## 2021-07-25 DIAGNOSIS — E785 Hyperlipidemia, unspecified: Secondary | ICD-10-CM | POA: Diagnosis not present

## 2021-07-25 DIAGNOSIS — I214 Non-ST elevation (NSTEMI) myocardial infarction: Secondary | ICD-10-CM | POA: Diagnosis not present

## 2021-07-25 HISTORY — PX: CORONARY STENT INTERVENTION: CATH118234

## 2021-07-25 HISTORY — PX: LEFT HEART CATH AND CORONARY ANGIOGRAPHY: CATH118249

## 2021-07-25 LAB — CBC
HCT: 36.9 % — ABNORMAL LOW (ref 39.0–52.0)
Hemoglobin: 12.3 g/dL — ABNORMAL LOW (ref 13.0–17.0)
MCH: 33.8 pg (ref 26.0–34.0)
MCHC: 33.3 g/dL (ref 30.0–36.0)
MCV: 101.4 fL — ABNORMAL HIGH (ref 80.0–100.0)
Platelets: 201 10*3/uL (ref 150–400)
RBC: 3.64 MIL/uL — ABNORMAL LOW (ref 4.22–5.81)
RDW: 13.3 % (ref 11.5–15.5)
WBC: 3.6 10*3/uL — ABNORMAL LOW (ref 4.0–10.5)
nRBC: 0 % (ref 0.0–0.2)

## 2021-07-25 LAB — POCT ACTIVATED CLOTTING TIME
Activated Clotting Time: 266 seconds
Activated Clotting Time: 341 seconds

## 2021-07-25 LAB — HEPARIN LEVEL (UNFRACTIONATED): Heparin Unfractionated: 0.56 IU/mL (ref 0.30–0.70)

## 2021-07-25 SURGERY — LEFT HEART CATH AND CORONARY ANGIOGRAPHY
Anesthesia: LOCAL

## 2021-07-25 MED ORDER — FENTANYL CITRATE (PF) 100 MCG/2ML IJ SOLN
INTRAMUSCULAR | Status: AC
  Filled 2021-07-25: qty 2

## 2021-07-25 MED ORDER — LIDOCAINE HCL (PF) 1 % IJ SOLN
INTRAMUSCULAR | Status: AC
  Filled 2021-07-25: qty 30

## 2021-07-25 MED ORDER — TICAGRELOR 90 MG PO TABS
ORAL_TABLET | ORAL | Status: AC
  Filled 2021-07-25: qty 2

## 2021-07-25 MED ORDER — SODIUM CHLORIDE 0.9 % IV SOLN: 250.0000 mL | INTRAVENOUS | Status: DC | PRN

## 2021-07-25 MED ORDER — SODIUM CHLORIDE 0.9% FLUSH: 3.0000 mL | INTRAVENOUS | Status: DC | PRN

## 2021-07-25 MED ORDER — HEPARIN (PORCINE) IN NACL 1000-0.9 UT/500ML-% IV SOLN
INTRAVENOUS | Status: AC
  Filled 2021-07-25: qty 1000

## 2021-07-25 MED ORDER — HEPARIN (PORCINE) IN NACL 1000-0.9 UT/500ML-% IV SOLN
INTRAVENOUS | Status: DC | PRN
  Administered 2021-07-25 (×2): 500 mL

## 2021-07-25 MED ORDER — LIDOCAINE HCL (PF) 1 % IJ SOLN
INTRAMUSCULAR | Status: DC | PRN
  Administered 2021-07-25: 2 mL

## 2021-07-25 MED ORDER — HEPARIN SODIUM (PORCINE) 1000 UNIT/ML IJ SOLN
INTRAMUSCULAR | Status: AC
  Filled 2021-07-25: qty 1

## 2021-07-25 MED ORDER — HEPARIN SODIUM (PORCINE) 1000 UNIT/ML IJ SOLN
INTRAMUSCULAR | Status: DC | PRN
  Administered 2021-07-25: 2000 [IU] via INTRAVENOUS
  Administered 2021-07-25 (×2): 4500 [IU] via INTRAVENOUS

## 2021-07-25 MED ORDER — MIDAZOLAM HCL 2 MG/2ML IJ SOLN
INTRAMUSCULAR | Status: DC | PRN
  Administered 2021-07-25: 1 mg via INTRAVENOUS

## 2021-07-25 MED ORDER — LABETALOL HCL 5 MG/ML IV SOLN: 10.0000 mg | INTRAVENOUS | Status: AC | PRN

## 2021-07-25 MED ORDER — VERAPAMIL HCL 2.5 MG/ML IV SOLN
INTRAVENOUS | Status: DC | PRN
  Administered 2021-07-25: 10 mL via INTRA_ARTERIAL

## 2021-07-25 MED ORDER — MIDAZOLAM HCL 2 MG/2ML IJ SOLN
INTRAMUSCULAR | Status: AC
  Filled 2021-07-25: qty 2

## 2021-07-25 MED ORDER — FENTANYL CITRATE (PF) 100 MCG/2ML IJ SOLN
INTRAMUSCULAR | Status: DC | PRN
  Administered 2021-07-25 (×2): 25 ug via INTRAVENOUS

## 2021-07-25 MED ORDER — TICAGRELOR 90 MG PO TABS
ORAL_TABLET | ORAL | Status: DC | PRN
  Administered 2021-07-25: 180 mg via ORAL

## 2021-07-25 MED ORDER — NITROGLYCERIN 1 MG/10 ML FOR IR/CATH LAB
INTRA_ARTERIAL | Status: AC
  Filled 2021-07-25: qty 10

## 2021-07-25 MED ORDER — SODIUM CHLORIDE 0.9% FLUSH
3.0000 mL | Freq: Two times a day (BID) | INTRAVENOUS | Status: DC
  Administered 2021-07-26: 3 mL via INTRAVENOUS

## 2021-07-25 MED ORDER — SODIUM CHLORIDE 0.9 % IV SOLN: INTRAVENOUS | Status: AC

## 2021-07-25 MED ORDER — HYDRALAZINE HCL 20 MG/ML IJ SOLN: 10.0000 mg | INTRAMUSCULAR | Status: AC | PRN

## 2021-07-25 MED ORDER — NITROGLYCERIN 1 MG/10 ML FOR IR/CATH LAB
INTRA_ARTERIAL | Status: DC | PRN
  Administered 2021-07-25: 200 ug via INTRACORONARY

## 2021-07-25 MED ORDER — IOHEXOL 350 MG/ML SOLN
INTRAVENOUS | Status: DC | PRN
  Administered 2021-07-25: 75 mL

## 2021-07-25 MED ORDER — TICAGRELOR 90 MG PO TABS
90.0000 mg | ORAL_TABLET | Freq: Two times a day (BID) | ORAL | Status: DC
  Administered 2021-07-25 – 2021-07-26 (×2): 90 mg via ORAL
  Filled 2021-07-25 (×2): qty 1

## 2021-07-25 MED ORDER — VERAPAMIL HCL 2.5 MG/ML IV SOLN
INTRAVENOUS | Status: AC
  Filled 2021-07-25: qty 2

## 2021-07-25 SURGICAL SUPPLY — 17 items
BALLN SAPPHIRE 2.0X15 (BALLOONS) ×2
BALLN SAPPHIRE ~~LOC~~ 3.0X12 (BALLOONS) ×1 IMPLANT
BALLOON SAPPHIRE 2.0X15 (BALLOONS) IMPLANT
CATH OPTITORQUE TIG 4.0 5F (CATHETERS) ×1 IMPLANT
CATH VISTA GUIDE 6FR JR4 (CATHETERS) ×1 IMPLANT
DEVICE RAD COMP TR BAND LRG (VASCULAR PRODUCTS) ×1 IMPLANT
GLIDESHEATH SLEND SS 6F .021 (SHEATH) ×1 IMPLANT
GUIDEWIRE INQWIRE 1.5J.035X260 (WIRE) IMPLANT
INQWIRE 1.5J .035X260CM (WIRE) ×2
KIT ENCORE 26 ADVANTAGE (KITS) ×1 IMPLANT
KIT HEART LEFT (KITS) ×2 IMPLANT
PACK CARDIAC CATHETERIZATION (CUSTOM PROCEDURE TRAY) ×2 IMPLANT
STENT ONYX FRONTIER 2.75X38 (Permanent Stent) ×1 IMPLANT
SYR MEDRAD MARK 7 150ML (SYRINGE) ×2 IMPLANT
TRANSDUCER W/STOPCOCK (MISCELLANEOUS) ×2 IMPLANT
TUBING CIL FLEX 10 FLL-RA (TUBING) ×2 IMPLANT
WIRE RUNTHROUGH .014X180CM (WIRE) ×1 IMPLANT

## 2021-07-25 NOTE — Progress Notes (Signed)
 Progress Note  Patient Name: Peter Dominguez Date of Encounter: 07/25/2021  [facility] Cardiologist: None New (Evanee Lubrano)  Subjective   Had 2 more episodes of angina at rest (each time manifesting as severe tightness in his left biceps) at 4 AM and 6 AM, promptly relieved with sublingual nitroglycerin each time.  Inpatient Medications    Scheduled Meds:  aspirin  81 mg Oral Daily   atorvastatin  80 mg Oral Daily   carvedilol  3.125 mg Oral BID WC   divalproex  1,000 mg Oral Daily   lacosamide  50 mg Oral BID   sodium chloride flush  3 mL Intravenous Q12H   Continuous Infusions:  sodium chloride     sodium chloride 1 mL/kg/hr (07/25/21 0540)   heparin 1,350 Units/hr (07/24/21 1934)   PRN Meds: sodium chloride, acetaminophen, nitroGLYCERIN, ondansetron (ZOFRAN) IV, sodium chloride flush   Vital Signs    Vitals:   07/24/21 1930 07/24/21 2306 07/25/21 0500 07/25/21 0837  BP: 117/76 127/89 (!) 139/91 113/73  Pulse: 60 62 (!) 57 61  Resp: (!) 21 14 15 17  Temp: 98.6 F (37 C) 98.6 F (37 C) 97.9 F (36.6 C) (!) 97.5 F (36.4 C)  TempSrc: Oral Oral Oral Oral  SpO2: 97% 99% 97%   Weight:      Height:        Intake/Output Summary (Last 24 hours) at 07/25/2021 1037 Last data filed at 07/25/2021 0541 Gross per 24 hour  Intake 668.98 ml  Output --  Net 668.98 ml   Last 3 Weights 07/24/2021 07/24/2021 12/03/2019  Weight (lbs) 192 lb 4.8 oz 203 lb 7.8 oz 203 lb 6.4 oz  Weight (kg) 87.227 kg 92.3 kg 92.262 kg      Telemetry    Normal sinus rhythm/mild sinus bradycardia- Personally Reviewed  ECG    Normal sinus rhythm, inferior sharp Q waves and T wave inversion- Personally Reviewed  Physical Exam  Appears comfortable lying fully flat in bed GEN: No acute distress.   Neck: No JVD Cardiac: RRR, no murmurs, rubs, or gallops.  Respiratory: Clear to auscultation bilaterally. GI: Soft, nontender, non-distended  MS: No edema; No deformity. Neuro:  Nonfocal   Psych: Normal affect   Labs    High Sensitivity Troponin:   Recent Labs  Lab 07/24/21 0030 07/24/21 0233  TROPONINIHS 656* 577*     Chemistry Recent Labs  Lab 07/24/21 0030  NA 135  K 4.4  CL 98  CO2 27  GLUCOSE 124*  BUN 11  CREATININE 1.21  CALCIUM 9.5  GFRNONAA >60  ANIONGAP 10    Lipids  Recent Labs  Lab 07/24/21 0030  CHOL 220*  TRIG 463*  HDL 36*  LDLCALC UNABLE TO CALCULATE IF TRIGLYCERIDE OVER 400 mg/dL  CHOLHDL 6.1    Hematology Recent Labs  Lab 07/24/21 0030 07/24/21 1801 07/25/21 0129  WBC 4.0 3.4* 3.6*  RBC 3.85* 3.41* 3.64*  HGB 13.3 11.6* 12.3*  HCT 38.9* 34.4* 36.9*  MCV 101.0* 100.9* 101.4*  MCH 34.5* 34.0 33.8  MCHC 34.2 33.7 33.3  RDW 13.7 13.5 13.3  PLT 220 189 201   Thyroid  Recent Labs  Lab 07/24/21 1114  TSH 2.271    BNPNo results for input(s): BNP, PROBNP in the last 168 hours.  DDimer No results for input(s): DDIMER in the last 168 hours.   Radiology    DG Chest 2 View  Result Date: 07/24/2021 CLINICAL DATA:  Chest pain.  Left biceps pain for   2 weeks. EXAM: CHEST - 2 VIEW COMPARISON:  CT 06/02/2019 FINDINGS: The heart size and mediastinal contours are within normal limits. Both lungs are clear. The visualized skeletal structures are unremarkable. IMPRESSION: No active cardiopulmonary disease. Electronically Signed   By: Burman Nieves M.D.   On: 07/24/2021 02:00   ECHOCARDIOGRAM COMPLETE  Result Date: 07/24/2021    ECHOCARDIOGRAM REPORT   Patient Name:   Peter Dominguez Date of Exam: 07/24/2021 Medical Rec #:  [id number]     Height:       70.0 in Accession #:    [id number]    Weight:       192.3 lb Date of Birth:  Apr 14, 1956     BSA:          2.053 m Patient Age:    65 years      BP:           113/40 mmHg Patient Gender: M             HR:           70 bpm. Exam Location:  Inpatient Procedure: 2D Echo, Cardiac Doppler and Color Doppler Indications:    NSTEMI I21.4  History:        Patient has prior history of Echocardiogram  examinations, most                 recent 05/09/2019. Stroke.  Sonographer:    Eulah Pont RDCS Referring Phys: [id number] CALLIE E GOODRICH IMPRESSIONS  1. Left ventricular ejection fraction, by estimation, is 65 to 70%. The left ventricle has hyperdynamic function. The left ventricle has no regional wall motion abnormalities. There is mild concentric left ventricular hypertrophy. Left ventricular diastolic parameters are consistent with Grade II diastolic dysfunction (pseudonormalization).  2. Right ventricular systolic function is normal. The right ventricular size is normal. There is normal pulmonary artery systolic pressure.  3. Left atrial size was mildly dilated.  4. The mitral valve is normal in structure. No evidence of mitral valve regurgitation. No evidence of mitral stenosis.  5. The aortic valve is normal in structure. Aortic valve regurgitation is not visualized. No aortic stenosis is present.  6. The inferior vena cava is normal in size with greater than 50% respiratory variability, suggesting right atrial pressure of 3 mmHg. Comparison(s): Prior images reviewed side by side. The left ventricular diastolic function is significantly worse. FINDINGS  Left Ventricle: Left ventricular ejection fraction, by estimation, is 65 to 70%. The left ventricle has hyperdynamic function. The left ventricle has no regional wall motion abnormalities. The left ventricular internal cavity size was normal in size. There is mild concentric left ventricular hypertrophy. Left ventricular diastolic parameters are consistent with Grade II diastolic dysfunction (pseudonormalization). Indeterminate filling pressures. Right Ventricle: The right ventricular size is normal. No increase in right ventricular wall thickness. Right ventricular systolic function is normal. There is normal pulmonary artery systolic pressure. The tricuspid regurgitant velocity is 2.43 m/s, and  with an assumed right atrial pressure of 3 mmHg, the  estimated right ventricular systolic pressure is 26.6 mmHg. Left Atrium: Left atrial size was mildly dilated. Right Atrium: Right atrial size was normal in size. Pericardium: There is no evidence of pericardial effusion. Mitral Valve: The mitral valve is normal in structure. No evidence of mitral valve regurgitation. No evidence of mitral valve stenosis. Tricuspid Valve: The tricuspid valve is normal in structure. Tricuspid valve regurgitation is not demonstrated. No evidence of tricuspid stenosis. Aortic Valve: The aortic valve is normal in  structure. Aortic valve regurgitation is not visualized. No aortic stenosis is present. Pulmonic Valve: The pulmonic valve was normal in structure. Pulmonic valve regurgitation is not visualized. No evidence of pulmonic stenosis. Aorta: The aortic root is normal in size and structure. Venous: The inferior vena cava is normal in size with greater than 50% respiratory variability, suggesting right atrial pressure of 3 mmHg. IAS/Shunts: No atrial level shunt detected by color flow Doppler.  LEFT VENTRICLE PLAX 2D LVIDd:         4.70 cm   Diastology LVIDs:         2.90 cm   LV e' medial:    7.14 cm/s LV PW:         1.30 cm   LV E/e' medial:  10.8 LV IVS:        1.25 cm   LV e' lateral:   8.43 cm/s LVOT diam:     2.20 cm   LV E/e' lateral: 9.1 LV SV:         95 LV SV Index:   46 LVOT Area:     3.80 cm  RIGHT VENTRICLE RV S prime:     12.30 cm/s TAPSE (M-mode): 2.4 cm LEFT ATRIUM             Index        RIGHT ATRIUM           Index LA diam:        4.00 cm 1.95 cm/m   RA Area:     18.10 cm LA Vol (A2C):   44.2 ml 21.53 ml/m  RA Volume:   42.40 ml  20.65 ml/m LA Vol (A4C):   39.9 ml 19.44 ml/m LA Biplane Vol: 46.3 ml 22.55 ml/m  AORTIC VALVE LVOT Vmax:   111.00 cm/s LVOT Vmean:  78.100 cm/s LVOT VTI:    0.249 m  AORTA Ao Asc diam: 3.50 cm MITRAL VALVE               TRICUSPID VALVE MV Area (PHT): 3.12 cm    TR Peak grad:   23.6 mmHg MV Decel Time: 243 msec    TR Vmax:         243.00 cm/s MV E velocity: 77.10 cm/s MV A velocity: 78.10 cm/s  SHUNTS MV E/A ratio:  0.99        Systemic VTI:  0.25 m                            Systemic Diam: 2.20 cm Thurmon Fair MD Electronically signed by Thurmon Fair MD Signature Date/Time: 07/24/2021/4:13:26 PM    Final     Cardiac Studies   Echocardiogram 07/24/2021  1. Left ventricular ejection fraction, by estimation, is 65 to 70%. The left ventricle has hyperdynamic function. The left ventricle has no regional wall motion abnormalities. There is mild concentric left ventricular hypertrophy. Left ventricular diastolic parameters are consistent with Grade II diastolic dysfunction (pseudonormalization).   2. Right ventricular systolic function is normal. The right ventricular size is normal. There is normal pulmonary artery systolic pressure.   3. Left atrial size was mildly dilated.   4. The mitral valve is normal in structure. No evidence of mitral valve regurgitation. No evidence of mitral stenosis.   5. The aortic valve is normal in structure. Aortic valve regurgitation is not visualized. No aortic stenosis is present.   6. The inferior vena cava is normal in size with  greater than 50% respiratory variability, suggesting right atrial pressure of 3 mmHg. Comparison(s): Prior images reviewed side by side. The left ventricular diastolic function is significantly worse.  Patient Profile     65 y.o. male with unstable angina with ECG changes suggesting inferior ischemia and positive troponin (small NSTEMI), history of stroke, hyperlipidemia, BPH  Assessment & Plan    We will go ahead with plan for invasive coronary angiography and revascularization as indicated today.  He is on aspirin, intravenous heparin and high-dose atorvastatin and we have started beta-blocker therapy (doubt he will be able to tolerate higher doses due to relative bradycardia).  This procedure has been fully reviewed with the patient and written informed consent  has been obtained.   For questions or updates, please contact [facility] Please consult [email or web address] for contact info under        Signed, Thurmon Fair, MD  07/25/2021, 10:37 AM

## 2021-07-25 NOTE — Plan of Care (Signed)
  Problem: Cardiovascular: Goal: Ability to achieve and maintain adequate cardiovascular perfusion will improve Outcome: Progressing   Problem: Cardiovascular: Goal: Vascular access site(s) Level 0-1 will be maintained Outcome: Progressing   Problem: Skin Integrity: Goal: Risk for impaired skin integrity will decrease Outcome: Progressing   Problem: Safety: Goal: Ability to remain free from injury will improve Outcome: Progressing   Problem: Pain Managment: Goal: General experience of comfort will improve Outcome: Progressing   Problem: Clinical Measurements: Goal: Cardiovascular complication will be avoided Outcome: Progressing   Problem: Clinical Measurements: Goal: Ability to maintain clinical measurements within normal limits will improve Outcome: Progressing   Problem: Education: Goal: Knowledge of General Education information will improve Description: Including pain rating scale, medication(s)/side effects and non-pharmacologic comfort measures Outcome: Progressing

## 2021-07-25 NOTE — Interval H&P Note (Signed)
History and Physical Interval Note:  07/25/2021 1:15 PM  Peter Dominguez  has presented today for surgery, with the diagnosis of NSTEMI.  The various methods of treatment have been discussed with the patient and family. After consideration of risks, benefits and other options for treatment, the patient has consented to  Procedure(s): LEFT HEART CATH AND CORONARY ANGIOGRAPHY (N/A)  PERCUTANEOUS CORONARY INTERVENTION  as a surgical intervention.  The patient's history has been reviewed, patient examined, no change in status, stable for surgery.  I have reviewed the patient's chart and labs.  Questions were answered to the patient's satisfaction.    Cath Lab Visit (complete for each Cath Lab visit)  Clinical Evaluation Leading to the Procedure:   ACS: Yes.    Non-ACS:    Anginal Classification: CCS IV  Anti-ischemic medical therapy: Maximal Therapy (2 or more classes of medications)  Non-Invasive Test Results: No non-invasive testing performed  Prior CABG: No previous CABG    Bryan Lemma

## 2021-07-25 NOTE — Progress Notes (Signed)
Patient to cath lab with transport

## 2021-07-25 NOTE — Progress Notes (Signed)
ANTICOAGULATION CONSULT NOTE   Pharmacy Consult for Heparin Indication: chest pain/ACS  Allergies  Allergen Reactions   Bupropion Swelling    Patient Measurements: Height: 5\' 10"  (177.8 cm) Weight: 87.2 kg (192 lb 4.8 oz) IBW/kg (Calculated) : 73 HEPARIN DW (KG): 87.2    Vital Signs: Temp: 97.5 F (36.4 C) (10/10 0837) Temp Source: Oral (10/10 0837) BP: 113/73 (10/10 0837) Pulse Rate: 61 (10/10 0837)  Labs: Recent Labs    07/24/21 0030 07/24/21 0233 07/24/21 1114 07/24/21 1248 07/24/21 1801 07/25/21 0129  HGB 13.3  --   --   --  11.6* 12.3*  HCT 38.9*  --   --   --  34.4* 36.9*  PLT 220  --   --   --  189 201  HEPARINUNFRC  --   --    < > 0.22* 0.14* 0.56  CREATININE 1.21  --   --   --   --   --   TROPONINIHS 656* 577*  --   --   --   --    < > = values in this interval not displayed.     Estimated Creatinine Clearance: 62 mL/min (by C-G formula based on SCr of 1.21 mg/dL).   Assessment: 65 y.o. male with arm pain and elevated troponin. Pharmacy consulted for heparin dosing in the setting of NSTEMI. Patient transferred from [facility] for higher level of care.   Heparin level therapeutic  Goal of Therapy:  Heparin level 0.3-0.7 units/ml Monitor platelets by anticoagulation protocol: Yes   Plan:  Continue heparin at 1350 units / hr Follow up after cath  [facility], PharmD Clinical Pharmacist **Pharmacist phone directory can now be found on [email or web address] (PW [email or web address]).  Listed under [facility].

## 2021-07-25 NOTE — H&P (View-Only) (Signed)
Progress Note  Patient Name: Peter Dominguez Date of Encounter: 07/25/2021  [facility] Cardiologist: None New (Obadiah Dennard)  Subjective   Had 2 more episodes of angina at rest (each time manifesting as severe tightness in his left biceps) at 4 AM and 6 AM, promptly relieved with sublingual nitroglycerin each time.  Inpatient Medications    Scheduled Meds:  aspirin  81 mg Oral Daily   atorvastatin  80 mg Oral Daily   carvedilol  3.125 mg Oral BID WC   divalproex  1,000 mg Oral Daily   lacosamide  50 mg Oral BID   sodium chloride flush  3 mL Intravenous Q12H   Continuous Infusions:  sodium chloride     sodium chloride 1 mL/kg/hr (07/25/21 0540)   heparin 1,350 Units/hr (07/24/21 1934)   PRN Meds: sodium chloride, acetaminophen, nitroGLYCERIN, ondansetron (ZOFRAN) IV, sodium chloride flush   Vital Signs    Vitals:   07/24/21 1930 07/24/21 2306 07/25/21 0500 07/25/21 0837  BP: 117/76 127/89 (!) 139/91 113/73  Pulse: 60 62 (!) 57 61  Resp: (!) 21 14 15 17   Temp: 98.6 F (37 C) 98.6 F (37 C) 97.9 F (36.6 C) (!) 97.5 F (36.4 C)  TempSrc: Oral Oral Oral Oral  SpO2: 97% 99% 97%   Weight:      Height:        Intake/Output Summary (Last 24 hours) at 07/25/2021 1037 Last data filed at 07/25/2021 0541 Gross per 24 hour  Intake 668.98 ml  Output --  Net 668.98 ml   Last 3 Weights 07/24/2021 07/24/2021 12/03/2019  Weight (lbs) 192 lb 4.8 oz 203 lb 7.8 oz 203 lb 6.4 oz  Weight (kg) 87.227 kg 92.3 kg 92.262 kg      Telemetry    Normal sinus rhythm/mild sinus bradycardia- Personally Reviewed  ECG    Normal sinus rhythm, inferior sharp Q waves and T wave inversion- Personally Reviewed  Physical Exam  Appears comfortable lying fully flat in bed GEN: No acute distress.   Neck: No JVD Cardiac: RRR, no murmurs, rubs, or gallops.  Respiratory: Clear to auscultation bilaterally. GI: Soft, nontender, non-distended  MS: No edema; No deformity. Neuro:  Nonfocal   Psych: Normal affect   Labs    High Sensitivity Troponin:   Recent Labs  Lab 07/24/21 0030 07/24/21 0233  TROPONINIHS 656* 577*     Chemistry Recent Labs  Lab 07/24/21 0030  NA 135  K 4.4  CL 98  CO2 27  GLUCOSE 124*  BUN 11  CREATININE 1.21  CALCIUM 9.5  GFRNONAA >60  ANIONGAP 10    Lipids  Recent Labs  Lab 07/24/21 0030  CHOL 220*  TRIG 463*  HDL 36*  LDLCALC UNABLE TO CALCULATE IF TRIGLYCERIDE OVER 400 mg/dL  CHOLHDL 6.1    Hematology Recent Labs  Lab 07/24/21 0030 07/24/21 1801 07/25/21 0129  WBC 4.0 3.4* 3.6*  RBC 3.85* 3.41* 3.64*  HGB 13.3 11.6* 12.3*  HCT 38.9* 34.4* 36.9*  MCV 101.0* 100.9* 101.4*  MCH 34.5* 34.0 33.8  MCHC 34.2 33.7 33.3  RDW 13.7 13.5 13.3  PLT 220 189 201   Thyroid  Recent Labs  Lab 07/24/21 1114  TSH 2.271    BNPNo results for input(s): BNP, PROBNP in the last 168 hours.  DDimer No results for input(s): DDIMER in the last 168 hours.   Radiology    DG Chest 2 View  Result Date: 07/24/2021 CLINICAL DATA:  Chest pain.  Left biceps pain for  2 weeks. EXAM: CHEST - 2 VIEW COMPARISON:  CT 06/02/2019 FINDINGS: The heart size and mediastinal contours are within normal limits. Both lungs are clear. The visualized skeletal structures are unremarkable. IMPRESSION: No active cardiopulmonary disease. Electronically Signed   By: Burman Nieves M.D.   On: 07/24/2021 02:00   ECHOCARDIOGRAM COMPLETE  Result Date: 07/24/2021    ECHOCARDIOGRAM REPORT   Patient Name:   Peter Dominguez Date of Exam: 07/24/2021 Medical Rec #:  [id number]     Height:       70.0 in Accession #:    [id number]    Weight:       192.3 lb Date of Birth:  Apr 14, 1956     BSA:          2.053 m Patient Age:    65 years      BP:           113/40 mmHg Patient Gender: M             HR:           70 bpm. Exam Location:  Inpatient Procedure: 2D Echo, Cardiac Doppler and Color Doppler Indications:    NSTEMI I21.4  History:        Patient has prior history of Echocardiogram  examinations, most                 recent 05/09/2019. Stroke.  Sonographer:    Eulah Pont RDCS Referring Phys: [id number] CALLIE E GOODRICH IMPRESSIONS  1. Left ventricular ejection fraction, by estimation, is 65 to 70%. The left ventricle has hyperdynamic function. The left ventricle has no regional wall motion abnormalities. There is mild concentric left ventricular hypertrophy. Left ventricular diastolic parameters are consistent with Grade II diastolic dysfunction (pseudonormalization).  2. Right ventricular systolic function is normal. The right ventricular size is normal. There is normal pulmonary artery systolic pressure.  3. Left atrial size was mildly dilated.  4. The mitral valve is normal in structure. No evidence of mitral valve regurgitation. No evidence of mitral stenosis.  5. The aortic valve is normal in structure. Aortic valve regurgitation is not visualized. No aortic stenosis is present.  6. The inferior vena cava is normal in size with greater than 50% respiratory variability, suggesting right atrial pressure of 3 mmHg. Comparison(s): Prior images reviewed side by side. The left ventricular diastolic function is significantly worse. FINDINGS  Left Ventricle: Left ventricular ejection fraction, by estimation, is 65 to 70%. The left ventricle has hyperdynamic function. The left ventricle has no regional wall motion abnormalities. The left ventricular internal cavity size was normal in size. There is mild concentric left ventricular hypertrophy. Left ventricular diastolic parameters are consistent with Grade II diastolic dysfunction (pseudonormalization). Indeterminate filling pressures. Right Ventricle: The right ventricular size is normal. No increase in right ventricular wall thickness. Right ventricular systolic function is normal. There is normal pulmonary artery systolic pressure. The tricuspid regurgitant velocity is 2.43 m/s, and  with an assumed right atrial pressure of 3 mmHg, the  estimated right ventricular systolic pressure is 26.6 mmHg. Left Atrium: Left atrial size was mildly dilated. Right Atrium: Right atrial size was normal in size. Pericardium: There is no evidence of pericardial effusion. Mitral Valve: The mitral valve is normal in structure. No evidence of mitral valve regurgitation. No evidence of mitral valve stenosis. Tricuspid Valve: The tricuspid valve is normal in structure. Tricuspid valve regurgitation is not demonstrated. No evidence of tricuspid stenosis. Aortic Valve: The aortic valve is normal in  structure. Aortic valve regurgitation is not visualized. No aortic stenosis is present. Pulmonic Valve: The pulmonic valve was normal in structure. Pulmonic valve regurgitation is not visualized. No evidence of pulmonic stenosis. Aorta: The aortic root is normal in size and structure. Venous: The inferior vena cava is normal in size with greater than 50% respiratory variability, suggesting right atrial pressure of 3 mmHg. IAS/Shunts: No atrial level shunt detected by color flow Doppler.  LEFT VENTRICLE PLAX 2D LVIDd:         4.70 cm   Diastology LVIDs:         2.90 cm   LV e' medial:    7.14 cm/s LV PW:         1.30 cm   LV E/e' medial:  10.8 LV IVS:        1.25 cm   LV e' lateral:   8.43 cm/s LVOT diam:     2.20 cm   LV E/e' lateral: 9.1 LV SV:         95 LV SV Index:   46 LVOT Area:     3.80 cm  RIGHT VENTRICLE RV S prime:     12.30 cm/s TAPSE (M-mode): 2.4 cm LEFT ATRIUM             Index        RIGHT ATRIUM           Index LA diam:        4.00 cm 1.95 cm/m   RA Area:     18.10 cm LA Vol (A2C):   44.2 ml 21.53 ml/m  RA Volume:   42.40 ml  20.65 ml/m LA Vol (A4C):   39.9 ml 19.44 ml/m LA Biplane Vol: 46.3 ml 22.55 ml/m  AORTIC VALVE LVOT Vmax:   111.00 cm/s LVOT Vmean:  78.100 cm/s LVOT VTI:    0.249 m  AORTA Ao Asc diam: 3.50 cm MITRAL VALVE               TRICUSPID VALVE MV Area (PHT): 3.12 cm    TR Peak grad:   23.6 mmHg MV Decel Time: 243 msec    TR Vmax:         243.00 cm/s MV E velocity: 77.10 cm/s MV A velocity: 78.10 cm/s  SHUNTS MV E/A ratio:  0.99        Systemic VTI:  0.25 m                            Systemic Diam: 2.20 cm Thurmon Fair MD Electronically signed by Thurmon Fair MD Signature Date/Time: 07/24/2021/4:13:26 PM    Final     Cardiac Studies   Echocardiogram 07/24/2021  1. Left ventricular ejection fraction, by estimation, is 65 to 70%. The left ventricle has hyperdynamic function. The left ventricle has no regional wall motion abnormalities. There is mild concentric left ventricular hypertrophy. Left ventricular diastolic parameters are consistent with Grade II diastolic dysfunction (pseudonormalization).   2. Right ventricular systolic function is normal. The right ventricular size is normal. There is normal pulmonary artery systolic pressure.   3. Left atrial size was mildly dilated.   4. The mitral valve is normal in structure. No evidence of mitral valve regurgitation. No evidence of mitral stenosis.   5. The aortic valve is normal in structure. Aortic valve regurgitation is not visualized. No aortic stenosis is present.   6. The inferior vena cava is normal in size with  greater than 50% respiratory variability, suggesting right atrial pressure of 3 mmHg. Comparison(s): Prior images reviewed side by side. The left ventricular diastolic function is significantly worse.  Patient Profile     65 y.o. male with unstable angina with ECG changes suggesting inferior ischemia and positive troponin (small NSTEMI), history of stroke, hyperlipidemia, BPH  Assessment & Plan    We will go ahead with plan for invasive coronary angiography and revascularization as indicated today.  He is on aspirin, intravenous heparin and high-dose atorvastatin and we have started beta-blocker therapy (doubt he will be able to tolerate higher doses due to relative bradycardia).  This procedure has been fully reviewed with the patient and written informed consent  has been obtained.   For questions or updates, please contact [facility] Please consult [email or web address] for contact info under        Signed, Thurmon Fair, MD  07/25/2021, 10:37 AM

## 2021-07-26 ENCOUNTER — Other Ambulatory Visit (HOSPITAL_COMMUNITY): Payer: Self-pay

## 2021-07-26 LAB — CBC
HCT: 37.5 % — ABNORMAL LOW (ref 39.0–52.0)
Hemoglobin: 12.5 g/dL — ABNORMAL LOW (ref 13.0–17.0)
MCH: 33.3 pg (ref 26.0–34.0)
MCHC: 33.3 g/dL (ref 30.0–36.0)
MCV: 100 fL (ref 80.0–100.0)
Platelets: 200 10*3/uL (ref 150–400)
RBC: 3.75 MIL/uL — ABNORMAL LOW (ref 4.22–5.81)
RDW: 13.3 % (ref 11.5–15.5)
WBC: 3.5 10*3/uL — ABNORMAL LOW (ref 4.0–10.5)
nRBC: 0 % (ref 0.0–0.2)

## 2021-07-26 LAB — BASIC METABOLIC PANEL
Anion gap: 6 (ref 5–15)
BUN: 12 mg/dL (ref 8–23)
CO2: 24 mmol/L (ref 22–32)
Calcium: 9 mg/dL (ref 8.9–10.3)
Chloride: 104 mmol/L (ref 98–111)
Creatinine, Ser: 0.97 mg/dL (ref 0.61–1.24)
GFR, Estimated: >60 mL/min (ref >60)
Glucose, Bld: 87 mg/dL (ref 70–99)
Potassium: 5.1 mmol/L (ref 3.5–5.1)
Sodium: 134 mmol/L — ABNORMAL LOW (ref 135–145)

## 2021-07-26 MED ORDER — NITROGLYCERIN 0.4 MG SL SUBL
0.4000 mg | SUBLINGUAL_TABLET | SUBLINGUAL | 2 refills | Status: DC | PRN
Start: 2021-07-26 — End: 2022-10-02

## 2021-07-26 MED ORDER — ATORVASTATIN CALCIUM 80 MG PO TABS
80.0000 mg | ORAL_TABLET | Freq: Every day | ORAL | 1 refills | Status: DC
Start: 2021-07-27 — End: 2021-07-26
  Filled 2021-07-26: qty 90, 90d supply, fill #0

## 2021-07-26 MED ORDER — TICAGRELOR 90 MG PO TABS: 90.0000 mg | ORAL_TABLET | Freq: Two times a day (BID) | ORAL | 3 refills | Status: DC

## 2021-07-26 MED ORDER — CARVEDILOL 3.125 MG PO TABS: 3.1250 mg | ORAL_TABLET | Freq: Two times a day (BID) | ORAL | 3 refills | Status: AC

## 2021-07-26 MED ORDER — ATORVASTATIN CALCIUM 80 MG PO TABS: 80.0000 mg | ORAL_TABLET | Freq: Every day | ORAL | 1 refills | Status: AC

## 2021-07-26 MED ORDER — TICAGRELOR 90 MG PO TABS
90.0000 mg | ORAL_TABLET | Freq: Two times a day (BID) | ORAL | 3 refills | Status: AC
  Filled 2021-07-26 (×2): qty 60, 30d supply, fill #0

## 2021-07-26 MED ORDER — CARVEDILOL 3.125 MG PO TABS
3.1250 mg | ORAL_TABLET | Freq: Two times a day (BID) | ORAL | 3 refills | Status: DC
  Filled 2021-07-26: qty 60, 30d supply, fill #0

## 2021-07-26 MED ORDER — NITROGLYCERIN 0.4 MG SL SUBL
0.4000 mg | SUBLINGUAL_TABLET | SUBLINGUAL | 2 refills | Status: DC | PRN
  Filled 2021-07-26: qty 25, 7d supply, fill #0

## 2021-07-26 NOTE — Plan of Care (Signed)

## 2021-07-26 NOTE — Progress Notes (Signed)
CARDIAC REHAB PHASE I   Stent/MI education completed with pt. Pt educated on importance of ASA, Plavix, statin, and NTG. Pt states he has had some statin intolerance in the past. Pt given MI book along with heart healthy diet. Pt states he quit smoking several months ago. Reviewed site care, restrictions, and exercise guidelines. Will refer to CRP II [place].   [id number] Reynold Bowen, RN BSN 07/26/2021 11:05 AM

## 2021-07-26 NOTE — Progress Notes (Signed)
Explained discharge instructions to patient and his wife. He was educated on incision care and post heart attack care. He verbalized having full awareness. All questions were answered. All belongings are in the patient's possession. Both Iv's were removed and patient is being transported downstairs for d/c. No further needs expressed.   Orvan Seen SWOT RN

## 2021-07-26 NOTE — Discharge Summary (Signed)
Discharge Summary    Patient ID: Peter Dominguez MRN: [id number]; DOB: 1955/11/26  Admit date: 07/24/2021 Discharge date: 07/26/2021  PCP:  Peter Basset, PA-C   [facility] Providers Cardiologist:  Peter Dominguez   Discharge Diagnoses    Principal Problem:   NSTEMI (non-ST elevated myocardial infarction) [facility]) Active Problems:   Hyperlipidemia   Coronary artery disease involving native coronary artery of native heart with unstable angina pectoris [facility])    Diagnostic Studies/Procedures    LHC on 07/25/21:  Severe Singel Vessel CAD: RCA prox 70% & culprit 95% mid (TIMI II flow) Successful DES PCI of the proximal and mid RCA covering both lesions with Onyx frontier DES 2.75 mm at 30 mm postdilated 3.1 to 2.9 mm);  lesions reduced to 5% and 0% post PCI with TIMI-3 flow pre and post.  TIMI-3 flow Normal Left Coronary System. Normal LVEF with no regional wall motion abnormality.  Mildly elevated LVEDP     RECOMMENDATIONS Transfer to 6 E. postprocedural unit.  TR band removal per protocol. Continue to titrate Guideline Directed Medical Therapy for ACS/CAD -> high-dose statin, beta-blocker +/- ARB/ACE inhibitor. DAPT x1 year Diagnostic Dominance: Right      Echo from 07/24/21:   1. Left ventricular ejection fraction, by estimation, is 65 to 70%. The  left ventricle has hyperdynamic function. The left ventricle has no  regional wall motion abnormalities. There is mild concentric left  ventricular hypertrophy. Left ventricular diastolic parameters are consistent with Grade II diastolic dysfunction (pseudonormalization).   2. Right ventricular systolic function is normal. The right ventricular  size is normal. There is normal pulmonary artery systolic pressure.   3. Left atrial size was mildly dilated.   4. The mitral valve is normal in structure. No evidence of mitral valve  regurgitation. No evidence of mitral stenosis.   5. The aortic valve is normal in  structure. Aortic valve regurgitation is  not visualized. No aortic stenosis is present.   6. The inferior vena cava is normal in size with greater than 50%  respiratory variability, suggesting right atrial pressure of 3 mmHg.   Comparison(s): Prior images reviewed side by side. The left ventricular  diastolic function is significantly worse.   _____________   History of Present Illness     Per admission H&P:  [name] is a 65 year old male with PMH of stroke in 04/2019 with residual right sided paresthesias, hyperlipidemia, BPH, and tobacco abuse. No prior cardiac history.  He recently quit smoking about 2 to 3 months ago.  Prior to this he had been smoking around 1 pack/day since he was 65 years old.  He has no known family history of heart disease or stroke.   Patient [facility] ED shortly after midnight on 07/24/2021 with reports of left arm pain.  Patient reported that over the last 2 weeks he has had 2 episodes of left arm pain at night when he lays down to go to bed.  He described the pain as a numbness and feeling like his arm is asleep.  The first episode occurred about 2 weeks ago and was associated with diaphoresis.  The second episode occurred 07/24/21 night which prompted him to come to the ED.  He denied any arm pain with exertion.  No chest pain, shortness of breath, orthopnea, PND, lower extremity edema, palpitations, lightheadedness, dizziness, syncope.  He denied any recent fevers or illnesses.  He did have a history of allergies so occasionally  has some nasal congestion and eye drainage but this is not Peter.  No GI symptoms.  No abnormal bleeding in urine or stools.   In the ED, vital stable.  EKG showed normal sinus rhythm with T waves in inferior leads which are Peter compared to prior tracing in 04/2019.  High-sensitivity troponin elevated at 656 >> 577.  Chest x-ray showed no acute findings. WBC 4.0, Hgb 13.3, Plts 220. Na 135, K 4.4, Glucose 124, BUN  11, Cr 1.21.  He was started on IV Heparin and given aspirin 324 mg.  He was admitted at [facility] for further cardiac work-up.     Hospital Course     Consultants: N/A    NSTEMI CAD - presented with 2 episodes of left arm pain at rest over the last 2 weeks. - EKG showed Peter T wave inversions in inferior leads.  - HS troponin peaked at 656. - Echo showed EF 65-70%, no RMWA, grade II DD (worsened),RV normal, no significant valvular disease  - LHC showed severe RCA disease s/p PCI DES of the prox and mid RCA; normal left coronary system, Normal LVEF, mildly elevated LVEDP 17 mmHg  - LDL unable to calculate, A1C 5.3% this admission  - Medical therapy: stopped IV heparin gtt, continue ASA 81 mg and Brilinta 90mg  BID for 12 month; started on Coreg 3.125mg  BID and Lipitor 80mg  this admission. BP low normal, will not add on ACEI/ARB at this time.  - Follow up arranged on 08/12/21 with cardiology - Post cath and site care discussed in detail at bedside   Hyperlipidemia - LDL 148 in 03/2021. Lipid panel unable calculate LDL this admission. May repeat outpatient. Goal <70.  - on Crestor 10mg  daily at home, switched to Lipitor 80mg  daily this admission  - Will need repeat lipid panel and LFTs in 2 months.   Prior left thalamic CVA - History of stroke in 04/2019 with residual right sided paresthesia. - Continue aspirin and statin as above.  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 - Yes ADP Receptor Inhibitor (Plavix/Clopidogrel, Brilinta/Ticagrelor or Effient/Prasugrel) prescribed (includes medically managed patients)? - Yes Beta Blocker prescribed? - Yes High Intensity Statin (Lipitor 40-80mg  or Crestor 20-40mg ) prescribed? - Yes EF  assessed during THIS hospitalization? - Yes For EF <40%, was ACEI/ARB prescribed? - Not Applicable (EF >/= 40%) For EF <40%, Aldosterone Antagonist (Spironolactone or Eplerenone) prescribed? - Not Applicable (EF >/= 40%) Cardiac Rehab Phase II ordered (including medically managed patients)? - Yes      _____________  Discharge Vitals Blood pressure 136/83, pulse 61, temperature 98.1 F (36.7 C), temperature source Oral, resp. rate 18, height 5\' 10"  (1.778 m), weight 87.2 kg, SpO2 100 %.  Filed Weights   07/24/21 0017 07/24/21 1226  Weight: 92.3 kg 87.2 kg   Physical exam: Vitals:  Vitals:   07/26/21 0529 07/26/21 1001  BP: (!) 115/58 136/83  Pulse: (!) 55 61  Resp: 18   Temp: 98.1 F (36.7 C)   SpO2: 100%    General Appearance: In no apparent distress, laying in bed HEENT: Normocephalic, atraumatic. EOMs intact.  Neck: Supple, trachea midline, no JVD Cardiovascular: Regular rate and rhythm, normal S1-S2,  no murmur/rub/gallop Respiratory: Resting breathing unlabored, lungs sounds clear to auscultation bilaterally, no use of accessory muscles. On room air.  No wheezes, rales or rhonchi.   Gastrointestinal: Bowel sounds positive, abdomen soft, non-tender, non-distended.  Extremities: Able to move all extremities in bed without difficulty, no edema/cyanosis/clubbing Genitourinary: Genital exam not performed Musculoskeletal: Normal muscle bulk and tone Skin: Intact, warm, dry. No rashes or petechiae noted in exposed areas.  Neurologic: Alert, oriented to person, place and time. Fluent speech, no cognitive deficit, no gross focal neuro deficit Psychiatric: Normal affect. Mood is appropriate.  Right radial site with dressing in place, no bleeding, radial pulse 2+, no gross sensory or motor deficit    Labs & Radiologic Studies    CBC Recent Labs    07/24/21 0030 07/24/21 1801 07/25/21 0129 07/26/21 0042  WBC 4.0   < > 3.6* 3.5*  NEUTROABS 1.2*  --   --   --   HGB 13.3   <  > 12.3* 12.5*  HCT 38.9*   < > 36.9* 37.5*  MCV 101.0*   < > 101.4* 100.0  PLT 220   < > 201 200   < > = values in this interval not displayed.   Basic Metabolic Panel Recent Labs    21/11/73 0030 07/26/21 0042  NA 135 134*  K 4.4 5.1  CL 98 104  CO2 27 24  GLUCOSE 124* 87  BUN 11 12  CREATININE 1.21 0.97  CALCIUM 9.5 9.0   Liver Function Tests No results for input(s): AST, ALT, ALKPHOS, BILITOT, PROT, ALBUMIN in the last 72 hours. No results for input(s): LIPASE, AMYLASE in the last 72 hours. High Sensitivity Troponin:   Recent Labs  Lab 07/24/21 0030 07/24/21 0233  TROPONINIHS 656* 577*    BNP Invalid input(s): POCBNP D-Dimer No results for input(s): DDIMER in the last 72 hours. Hemoglobin A1C Recent Labs    07/24/21 0030  HGBA1C 5.3   Fasting Lipid Panel Recent Labs    07/24/21 0030  CHOL 220*  HDL 36*  LDLCALC UNABLE TO CALCULATE IF TRIGLYCERIDE OVER 400 mg/dL  TRIG 567*  CHOLHDL 6.1  LDLDIRECT 144.6*   Thyroid Function Tests Recent Labs    07/24/21 1114  TSH 2.271   _____________  DG Chest 2 View  Result Date: 07/24/2021 CLINICAL DATA:  Chest pain.  Left biceps pain for 2 weeks. EXAM: CHEST - 2 VIEW COMPARISON:  CT 06/02/2019 FINDINGS: The heart size and mediastinal contours are within normal limits. Both lungs are clear. The visualized skeletal structures are unremarkable. IMPRESSION: No active cardiopulmonary disease. Electronically Signed   By: Burman Nieves M.D.   On: 07/24/2021 02:00   CARDIAC CATHETERIZATION  Result Date: 07/25/2021   Prox RCA lesion is 70% stenosed.  (CULPRIT LESION) Prox RCA to Mid RCA lesion is 95% stenosed.   A drug-eluting stent was successfully placed covering both lesions, using a STENT ONYX FRONTIER [id number].  Postdilated from 2.9-3.1 distal to proximal.   Mid RCA lesion is 30% stenosed.   Minimal CAD noted in the left system.   ---------------------------------------------   The left ventricular systolic function  is normal.  The left ventricular ejection fraction is greater than 65% by visual estimate   LV end diastolic pressure is normal.   There is no aortic valve stenosis.   There is no mitral valve regurgitation. SUMMARY Severe Singel Vessel CAD: RCA prox 70% &  culprit 95% mid (TIMI II flow) Successful DES PCI of the proximal and mid RCA covering both lesions with Onyx frontier DES 2.75 mm at 30 mm postdilated 3.1 to 2.9 mm); lesions reduced to 5% and 0% post PCI with TIMI-3 flow pre and post.  TIMI-3 flow Normal Left Coronary System. Normal LVEF with no regional wall motion abnormality.  Mildly elevated LVEDP RECOMMENDATIONS Transfer to 6 E. postprocedural unit.  TR band removal per protocol. Continue to titrate Guideline Directed Medical Therapy for ACS/CAD -> high-dose statin, beta-blocker +/- ARB/ACE inhibitor. DAPT x1 year Bryan Lemma, MD   ECHOCARDIOGRAM COMPLETE  Result Date: 07/24/2021    ECHOCARDIOGRAM REPORT   Patient Name:   DELMUS WARWICK Date of Exam: 07/24/2021 Medical Rec #:  [id number]     Height:       70.0 in Accession #:    [id number]    Weight:       192.3 lb Date of Birth:  [birth year]     BSA:          2.053 m Patient Age:    65 years      BP:           113/40 mmHg Patient Gender: M             HR:           70 bpm. Exam Location:  Inpatient Procedure: 2D Echo, Cardiac Doppler and Color Doppler Indications:    NSTEMI I21.4  History:        Patient has prior history of Echocardiogram examinations, most                 recent 05/09/2019. Stroke.  Sonographer:    Eulah Pont RDCS Referring Phys: [id number] CALLIE E GOODRICH IMPRESSIONS  1. Left ventricular ejection fraction, by estimation, is 65 to 70%. The left ventricle has hyperdynamic function. The left ventricle has no regional wall motion abnormalities. There is mild concentric left ventricular hypertrophy. Left ventricular diastolic parameters are consistent with Grade II diastolic dysfunction (pseudonormalization).  2. Right ventricular  systolic function is normal. The right ventricular size is normal. There is normal pulmonary artery systolic pressure.  3. Left atrial size was mildly dilated.  4. The mitral valve is normal in structure. No evidence of mitral valve regurgitation. No evidence of mitral stenosis.  5. The aortic valve is normal in structure. Aortic valve regurgitation is not visualized. No aortic stenosis is present.  6. The inferior vena cava is normal in size with greater than 50% respiratory variability, suggesting right atrial pressure of 3 mmHg. Comparison(s): Prior images reviewed side by side. The left ventricular diastolic function is significantly worse. FINDINGS  Left Ventricle: Left ventricular ejection fraction, by estimation, is 65 to 70%. The left ventricle has hyperdynamic function. The left ventricle has no regional wall motion abnormalities. The left ventricular internal cavity size was normal in size. There is mild concentric left ventricular hypertrophy. Left ventricular diastolic parameters are consistent with Grade II diastolic dysfunction (pseudonormalization). Indeterminate filling pressures. Right Ventricle: The right ventricular size is normal. No increase in right ventricular wall thickness. Right ventricular systolic function is normal. There is normal pulmonary artery systolic pressure. The tricuspid regurgitant velocity is 2.43 m/s, and  with an assumed right atrial pressure of 3 mmHg, the estimated right ventricular systolic pressure is 26.6 mmHg. Left Atrium: Left atrial size was mildly dilated. Right Atrium: Right atrial size was normal in size. Pericardium: There is no evidence of pericardial effusion. Mitral  Valve: The mitral valve is normal in structure. No evidence of mitral valve regurgitation. No evidence of mitral valve stenosis. Tricuspid Valve: The tricuspid valve is normal in structure. Tricuspid valve regurgitation is not demonstrated. No evidence of tricuspid stenosis. Aortic Valve: The  aortic valve is normal in structure. Aortic valve regurgitation is not visualized. No aortic stenosis is present. Pulmonic Valve: The pulmonic valve was normal in structure. Pulmonic valve regurgitation is not visualized. No evidence of pulmonic stenosis. Aorta: The aortic root is normal in size and structure. Venous: The inferior vena cava is normal in size with greater than 50% respiratory variability, suggesting right atrial pressure of 3 mmHg. IAS/Shunts: No atrial level shunt detected by color flow Doppler.  LEFT VENTRICLE PLAX 2D LVIDd:         4.70 cm   Diastology LVIDs:         2.90 cm   LV e' medial:    7.14 cm/s LV PW:         1.30 cm   LV E/e' medial:  10.8 LV IVS:        1.25 cm   LV e' lateral:   8.43 cm/s LVOT diam:     2.20 cm   LV E/e' lateral: 9.1 LV SV:         95 LV SV Index:   46 LVOT Area:     3.80 cm  RIGHT VENTRICLE RV S prime:     12.30 cm/s TAPSE (M-mode): 2.4 cm LEFT ATRIUM             Index        RIGHT ATRIUM           Index LA diam:        4.00 cm 1.95 cm/m   RA Area:     18.10 cm LA Vol (A2C):   44.2 ml 21.53 ml/m  RA Volume:   42.40 ml  20.65 ml/m LA Vol (A4C):   39.9 ml 19.44 ml/m LA Biplane Vol: 46.3 ml 22.55 ml/m  AORTIC VALVE LVOT Vmax:   111.00 cm/s LVOT Vmean:  78.100 cm/s LVOT VTI:    0.249 m  AORTA Ao Asc diam: 3.50 cm MITRAL VALVE               TRICUSPID VALVE MV Area (PHT): 3.12 cm    TR Peak grad:   23.6 mmHg MV Decel Time: 243 msec    TR Vmax:        243.00 cm/s MV E velocity: 77.10 cm/s MV A velocity: 78.10 cm/s  SHUNTS MV E/A ratio:  0.99        Systemic VTI:  0.25 m                            Systemic Diam: 2.20 cm Rachelle Hora Croitoru MD Electronically signed by Thurmon Fair MD Signature Date/Time: 07/24/2021/4:13:26 PM    Final    Disposition   Patient no longer having any arm pain. He is eager to home, feels well. Discharge instructions discussed in detail at bedside. Patient is agreeable with follow up on 08/12/21. Patient is seen with MD and deemed stable fr  DC. All Peter script sent to [facility] pharmacy.  Pt is being discharged home today in good condition.  Follow-up Plans & Appointments     Follow-up Information     Corrin Parker, PA-C Follow up on 08/12/2021.   Specialties: Physician Assistant, Cardiology Why:  11:15 AM for your cardiology appointment Contact information: [address] [place] [address] [phone]                Discharge Instructions     Diet - low sodium heart healthy   Complete by: As directed    Discharge instructions   Complete by: As directed    PLEASE REMEMBER TO BRING ALL OF YOUR MEDICATIONS TO EACH OF YOUR FOLLOW-UP OFFICE VISITS.  PLEASE ATTEND ALL SCHEDULED FOLLOW-UP APPOINTMENTS.   Activity: Increase activity slowly as tolerated. You may shower, but no soaking baths (or swimming) for 1 week. No driving for 24 hours. No lifting over 5 lbs for 1 week. No sexual activity for 1 week.   You May Return to Work: in 2 weeks (if applicable)  Wound Care: You may wash cath site gently with soap and water. Keep cath site clean and dry. If you notice pain, swelling, bleeding or pus at your cath site, please call [phone].    PLEASE DO NOT MISS ANY DOSES OF YOUR ASPIRIN/BRILINTA!!!!!   Also keep a log of you blood pressures and bring back to your follow up appt. Please call the office with any questions.   Patients taking blood thinners should generally stay away from medicines like ibuprofen, Advil, Motrin, naproxen, and Aleve due to risk of stomach bleeding. You may take Tylenol as directed or talk to your primary doctor about alternatives.  PLEASE ENSURE THAT YOU DO NOT RUN OUT OF YOUR ASPIRIN/BRILINTA. This medication is very important to remain on for at least one year. IF you have issues obtaining this medication due to cost please CALL the office 3-5 business days prior to running out in order to prevent missing doses of this medication.    Radial Site Care  Refer to this  sheet in the next few weeks. These instructions provide you with information on caring for yourself after your procedure. Your caregiver may also give you more specific instructions. Your treatment has been planned according to current medical practices, but problems sometimes occur. Call your caregiver if you have any problems or questions after your procedure.  HOME CARE INSTRUCTIONS You may shower the day after the procedure. Remove the bandage (dressing) and gently wash the site with plain soap and water. Gently pat the site dry.  Do not apply powder or lotion to the site.  Do not submerge the affected site in water for 3 to 5 days.  Inspect the site at least twice daily.  Do not flex or bend the affected arm for 24 hours.  No lifting over 5 pounds (2.3 kg) for 5 days after your procedure.  Do not drive home if you are discharged the same day of the procedure. Have someone else drive you.  You may drive 24 hours after the procedure unless otherwise instructed by your caregiver.   What to expect: Any bruising will usually fade within 1 to 2 weeks.  Blood that collects in the tissue (hematoma) may be painful to the touch. It should usually decrease in size and tenderness within 1 to 2 weeks.   SEEK IMMEDIATE MEDICAL CARE IF: You have unusual pain at the radial site.  You have redness, warmth, swelling, or pain at the radial site.  You have drainage (other than a small amount of blood on the dressing).  You have chills.  You have a fever or persistent symptoms for more than 72 hours.  You have a fever and your symptoms  suddenly get worse.  Your arm becomes pale, cool, tingly, or numb.  You have heavy bleeding from the site. Hold pressure on the site.   Increase activity slowly   Complete by: As directed        Discharge Medications   Allergies as of 07/26/2021       Reactions   Bupropion Swelling        Medication List     STOP taking these medications    amitriptyline  10 MG tablet Commonly known as: ELAVIL   rosuvastatin 10 MG tablet Commonly known as: CRESTOR   topiramate 25 MG tablet Commonly known as: TOPAMAX       TAKE these medications    aspirin 81 MG EC tablet Take 1 tablet (81 mg total) by mouth daily.   atorvastatin 80 MG tablet Commonly known as: LIPITOR Take 1 tablet (80 mg total) by mouth daily. Start taking on: July 27, 2021   carvedilol 3.125 MG tablet Commonly known as: COREG Take 1 tablet (3.125 mg total) by mouth 2 (two) times daily with a meal.   clotrimazole-betamethasone cream Commonly known as: LOTRISONE Apply 1 application topically 2 (two) times daily as needed for rash.   divalproex 500 MG 24 hr tablet Commonly known as: DEPAKOTE ER Take 1,000 mg by mouth daily.   Lacosamide 100 MG Tabs Take 0.5 tablets by mouth 2 (two) times daily.   nitroGLYCERIN 0.4 MG SL tablet Commonly known as: NITROSTAT Place 1 tablet (0.4 mg total) under the tongue every 5 (five) minutes x 3 doses as needed for chest pain.   ticagrelor 90 MG Tabs tablet Commonly known as: BRILINTA Take 1 tablet (90 mg total) by mouth 2 (two) times daily.           Outstanding Labs/Studies     Duration of Discharge Encounter   Greater than 30 minutes including physician time.  Signed, Cyndi Bender, NP 07/26/2021, 10:29 AM

## 2021-07-28 ENCOUNTER — Telehealth (HOSPITAL_COMMUNITY): Payer: Self-pay

## 2021-07-28 NOTE — Telephone Encounter (Signed)
Per phase I cardiac rehab, fax cardiac rehab referral to [place] [facility]. 

## 2021-07-29 ENCOUNTER — Telehealth: Payer: Self-pay | Admitting: Pharmacist

## 2021-07-29 ENCOUNTER — Other Ambulatory Visit (HOSPITAL_COMMUNITY): Payer: Self-pay

## 2021-07-29 NOTE — Telephone Encounter (Signed)
Pharmacy Transitions of Care Follow-up Telephone Call  Date of discharge: 07/26/2021  Discharge Diagnosis: NSTEMI  How have you been since you were released from the hospital? Peter Dominguez is doing good since his discharge home.   Medication changes made at discharge:  - START: Ticagrelor 90mg  BID   Atorvastatin 80mg  QD   Carvedilol 3.125mg  BID   Nitroglycerin 0.4mg   - STOPPED: Rosuvastatin   Topiramate   Amitriptyline  - CHANGED: n/a  Medication changes verified by the patient? yes     Medication Accessibility:  Home Pharm[facility] [address] in [facility]   Was the patient provided with refills on discharged medications? yes   Have all prescriptions been transferred from [facility] to home pharmacy? no   Is the patient able to afford medications? yes Notable copays: n/a Eligible patient assistance: n/a    Medication Review:  TICAGRELOR (BRILINTA) Ticagrelor 90 mg BID initiated on 07/26/21.  - Educated patient on expected duration of therapy of aspirin 81mg  with ticagrelor.  - Discussed importance of taking medication around the same time every day, - Reviewed potential DDIs with patient - Advised patient of medications to avoid (NSAIDs, aspirin maintenance doses>100 mg daily) - Educated that Tylenol (acetaminophen) will be the preferred analgesic to prevent risk of bleeding  - Emphasized importance of monitoring for signs and symptoms of bleeding (abnormal bruising, prolonged bleeding, nose bleeds, bleeding from gums, discolored urine, black tarry stools)  - Educated patient to notify doctor if shortness of breath or abnormal heartbeat occur - Advised patient to alert all providers of antiplatelet therapy prior to starting a new medication or having a procedure   WARFARIN - Please verify dosing schedule and ensure they are scheduled to get INR checked at a clinic  ENOXAPARIN - Please verify dosing and anticipated length of therapy. Read discharge note to confirm date of  completion and verify this with the patien  Follow-up Appointments:  PCP Hospital f/u appt confirmed? Yes Scheduled to see [facility] on 09/23/2021.   Specialist Hospital f/u appt confirmed? yes Scheduled to see Cardiology on 08/12/2021.   If their condition worsens, is the pt aware to call PCP or go to the Emergency Dept.? yes  Final Patient Assessment: Peter Dominguez is doing well since his discharge from the hospital. He has not experienced any side effects from his medication.  He wife has medication organized in a pill box.

## 2021-08-05 NOTE — Progress Notes (Signed)
Cardiology Office Note:    Date:  08/12/2021   ID:  Peter Dominguez, DOB [birth year], MRN [id number]  PCP:  Katherine Basset, PA-C  Cardiologist:  Thurmon Fair, MD  Electrophysiologist:  None   Referring MD: Katherine Basset, PA-C   Chief Complaint: hospital follow-up for NSTEMI  History of Present Illness:    Peter Dominguez is a 65 y.o. 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 Royann Shivers and presents today for hospital follow-up after recent NSTEMI.  Patient was recently admitted from 07/24/2021 to 07/26/2021 for NSTEMI after presenting with left arm pain. EKG showed new T wave inversions in inferior leads. High-sensitivity troponin peaked at 656. Echo showed LVEF of 65-70% with normal wall motion and grade 2 diastolic dysfunction. Left cardiac catheterization showed severe single vessel CAD with 70% stenosis of the proximal RCA and 95% stenosis of the mid RCA. Patient underwent successful PCI with DES covering both the proximal and mid RCA lesions. Patient was started on DAPT with Aspirin and Brilinta as well as a beta-blocker and high-intensity statin. Of note, Triglycerides were severely elevated at 463 and direct LDL was 144. Home Crestor 10mg  daily was stopped and he was started on Lipitor 80mg  daily instead.   Patient presents today for follow-up. Here with his daughter. Patient has done well since discharge.  He had one episode of lower left arm pain 2 days ago.  He states this was similar to the pain he had in his left upper arm prior to his NSTEMI but less severe.  It only lasted for 5 minutes and resolved on its own.  He denies any other episodes of left arm pain.  No chest pain.  No shortness of breath, orthopnea, PND, lower extremity edema,  palpitations, lightheadedness, dizziness, syncope.  He is compliant with his medications including DAPT and is tolerating them well.  Past Medical History:  Diagnosis Date   CAD (coronary artery disease)    History of non-ST elevation myocardial infarction (NSTEMI)    Hyperlipidemia    Stroke [facility])     Past Surgical History:  Procedure Laterality Date   CORONARY STENT INTERVENTION N/A 07/25/2021   Procedure: CORONARY STENT INTERVENTION;  Surgeon: [facility], MD;  Location: [facility] INVASIVE CV LAB;  Service: Cardiovascular;  Laterality: N/A;   LEFT HEART CATH AND CORONARY ANGIOGRAPHY N/A 07/25/2021   Procedure: LEFT HEART CATH AND CORONARY ANGIOGRAPHY;  Surgeon: [facility], MD;  Location: [facility] INVASIVE CV LAB;  Service: Cardiovascular;  Laterality: N/A;    Current Medications: Current Meds  Medication Sig   aspirin EC 81 MG EC tablet Take 1 tablet (81 mg total) by mouth daily.   atorvastatin (LIPITOR) 80 MG tablet Take 1 tablet (80 mg total) by mouth daily.   carvedilol (COREG) 3.125 MG tablet Take 1 tablet (3.125 mg total) by mouth 2 (two) times daily with a meal.   clotrimazole-betamethasone (LOTRISONE) cream Apply 1 application topically 2 (two) times daily as needed for rash.   divalproex (DEPAKOTE ER) 500 MG 24 hr tablet Take 1,000 mg by mouth daily.   Lacosamide 100 MG TABS Take 0.5 tablets by mouth 2 (two) times daily.   nitroGLYCERIN (NITROSTAT) 0.4 MG SL tablet Place 1 tablet (0.4 mg total) under the tongue every 5 (five) minutes  x 3 doses as needed for chest pain.   ticagrelor (BRILINTA) 90 MG TABS tablet Take 1 tablet (90 mg total) by mouth 2 (two) times daily.   ticagrelor (BRILINTA) 90 MG TABS tablet Take 1 tablet (90 mg total) by mouth 2 (two) times daily.   [DISCONTINUED] rosuvastatin (CRESTOR) 20 MG tablet Take 20 mg by mouth daily.     Allergies:   Bupropion   Social History   Socioeconomic History   Marital status: Married    Spouse name: Not on file    Number of children: Not on file   Years of education: Not on file   Highest education level: Not on file  Occupational History   Not on file  Tobacco Use   Smoking status: Former    Types: Cigarettes    Quit date: 05/13/2019    Years since quitting: 2.2   Smokeless tobacco: Never  Substance and Sexual Activity   Alcohol use: Yes    Alcohol/week: 1.0 standard drink    Types: 1 Shots of liquor per week    Comment: social    Drug use: No   Sexual activity: Not on file  Other Topics Concern   Not on file  Social History Narrative   Not on file   Social Determinants of Health   Financial Resource Strain: Not on file  Food Insecurity: Not on file  Transportation Needs: Not on file  Physical Activity: Not on file  Stress: Not on file  Social Connections: Not on file     Family History: The patient's family history is negative for Heart disease and Stroke.  ROS:   Please see the history of present illness.     EKGs/Labs/Other Studies Reviewed:    The following studies were reviewed today:  Echocardiogram 07/24/2021: Impressions: 1. Left ventricular ejection fraction, by estimation, is 65 to 70%. The  left ventricle has hyperdynamic function. The left ventricle has no  regional wall motion abnormalities. There is mild concentric left  ventricular hypertrophy. Left ventricular  diastolic parameters are consistent with Grade II diastolic dysfunction  (pseudonormalization).   2. Right ventricular systolic function is normal. The right ventricular  size is normal. There is normal pulmonary artery systolic pressure.   3. Left atrial size was mildly dilated.   4. The mitral valve is normal in structure. No evidence of mitral valve  regurgitation. No evidence of mitral stenosis.   5. The aortic valve is normal in structure. Aortic valve regurgitation is  not visualized. No aortic stenosis is present.   6. The inferior vena cava is normal in size with greater than 50%   respiratory variability, suggesting right atrial pressure of 3 mmHg.   Comparison(s): Prior images reviewed side by side. The left ventricular  diastolic function is significantly worse.  _______________  Left Cardiac Catheterization 07/25/2021:   Prox RCA lesion is 70% stenosed.  (CULPRIT LESION) Prox RCA to Mid RCA lesion is 95% stenosed.   A drug-eluting stent was successfully placed covering both lesions, using a STENT ONYX FRONTIER [id number].  Postdilated from 2.9-3.1 distal to proximal.   Mid RCA lesion is 30% stenosed.   Minimal CAD noted in the left system.   ---------------------------------------------   The left ventricular systolic function is normal.  The left ventricular ejection fraction is greater than 65% by visual estimate   LV end diastolic pressure is normal.   There is no aortic valve stenosis.   There is no mitral valve regurgitation.   Summary: Severe  Singel Vessel CAD: RCA prox 70% & culprit 95% mid (TIMI II flow) Successful DES PCI of the proximal and mid RCA covering both lesions with Onyx frontier DES 2.75 mm at 30 mm postdilated 3.1 to 2.9 mm);  lesions reduced to 5% and 0% post PCI with TIMI-3 flow pre and post.  TIMI-3 flow Normal Left Coronary System. Normal LVEF with no regional wall motion abnormality.  Mildly elevated LVEDP   Recommendations: Transfer to 6 E. postprocedural unit.  TR band removal per protocol. Continue to titrate Guideline Directed Medical Therapy for ACS/CAD -> high-dose statin, beta-blocker +/- ARB/ACE inhibitor. DAPT x1 year   Diagnostic Dominance: Right Intervention    EKG:  EKG ordered today. EKG personally reviewed and demonstrates normal sinus rhythm, rate 68 bpm, with deep T wave inversions in inferior leads and mil T wave abnormalities in leads V5-V6 but no significant changes compared to prior tracing. PR and QRS intervals normal. Normal axis. QTc 399 ms.  Recent Labs: 07/24/2021: TSH 2.271 07/26/2021: BUN 12;  Creatinine, Ser 0.97; Hemoglobin 12.5; Platelets 200; Potassium 5.1; Sodium 134  Recent Lipid Panel    Component Value Date/Time   CHOL 220 (H) 07/24/2021 0030   TRIG 463 (H) 07/24/2021 0030   HDL 36 (L) 07/24/2021 0030   CHOLHDL 6.1 07/24/2021 0030   VLDL UNABLE TO CALCULATE IF TRIGLYCERIDE OVER 400 mg/dL 32/44/0102 7253   LDLCALC UNABLE TO CALCULATE IF TRIGLYCERIDE OVER 400 mg/dL 66/44/0347 4259   LDLDIRECT 144.6 (H) 07/24/2021 0030    Physical Exam:    Vital Signs: BP 117/72   Pulse 68   Ht 5\' 10"  (1.778 m)   Wt 197 lb 9.6 oz (89.6 kg)   SpO2 96%   BMI 28.35 kg/m     Wt Readings from Last 3 Encounters:  08/12/21 197 lb 9.6 oz (89.6 kg)  07/24/21 192 lb 4.8 oz (87.2 kg)  12/03/19 203 lb 6.4 oz (92.3 kg)     General: 65 y.o. African-American male in no acute distress. HEENT: Normocephalic and atraumatic. Sclera clear.  Neck: Supple. No carotid bruits. No JVD. Heart: RRR. Distinct S1 and S2. No murmurs, gallops, or rubs. Radial pulses 2+ and equal bilaterally. Right radial cath site soft with no signs of hematoma. Lungs: No increased work of breathing. Clear to ausculation bilaterally. No wheezes, rhonchi, or rales.  Abdomen: Soft, non-distended, and non-tender to palpation.  MSK: Normal strength and tone for age.  Extremities: No lower extremity edema.    Skin: Warm and dry. Neuro: Alert and oriented x3. Residual right sided deficits from prior stroke but no acute focal deficits. Psych: Normal affect. Responds appropriately.  Assessment:    1. Coronary artery disease involving native coronary artery of native heart with angina pectoris (HCC)   2. History of non-ST elevation myocardial infarction (NSTEMI)   3. Hyperlipidemia, unspecified hyperlipidemia type   4. History of stroke   5. Medication management     Plan:    CAD with Recent NSTEMI - Patient recent admitted with NSTEMI. S/p DES to proximal to mid RCA. - He had one episode of mild left arm pain a couple  days ago (similar to symptoms prior to NSTEMI but less severe) that only last for 5 minutes and then resolved. This was an isolated event. No chest pain. - Continue DAPT with Aspirin and Brilinta. - Continue Coreg 3.125mg  twice daily and Lipitor 80mg  daily.  - Advised patient to let 71 know if he has any recurrent episodes of left arm  pain. Can consider starting Imdur if this happens. - Increase activity as tolerated. Recommend Cardiac Rehab.  Hyperlipidemia - Lipid panel during recent admission: Total Cholesterol 220, Triglycerides 463, HDL 36, LDL unable to be calculated due to triglyceride level. Direct LDL 144.6. - LDL goal <70 given CAD. - Switched from Crestor to Lipitor 80mg  during recent admission. Will continue Lipitor 80mg  daily. Patient has been taking Crestor as well. Advised him to stop taking this.  - Will need repeat lipid panel and LFTs in 6-8 weeks. If LDL still above goal, will add Zetia. If triglycerides still elevated, may need to add Vascepa.   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 - Continue antiplatelet and stain therapy as above.  Disposition: Follow up in 3 months.    Medication Adjustments/Labs and Tests Ordered: Current medicines are reviewed at length with the patient today.  Concerns regarding medicines are outlined above.  Orders Placed This Encounter  Procedures   Lipid panel   Hepatic function panel   EKG 12-Lead   No orders of the defined types were placed in this encounter.   Patient Instructions  Medication Instructions:  The current medical regimen is effective;  continue present plan and medications as directed. Please refer to the Current Medication list given to you today.  *If you need a refill on your cardiac medications before your next appointment, please call your pharmacy*  Lab Work: FASTIN LIPID AND LFT IN 2 MONTHS If you have labs (blood work)  drawn today and your tests are completely normal, you will receive your results only by:  MyChart Message (if you have MyChart) OR A paper copy in the mail.  If you have any lab test that is abnormal or we need to change your treatment, we will call you to review the results. You may go to any [facility] that is convenient for you however, we do have a lab in our office that is able to assist you. You DO NOT need an appointment for our lab. The lab is open 8:00am and closes at 4:00pm. Lunch 12:45 - 1:45pm.  Testing/Procedures: NONE  Special Instructions PLEASE FILL OUT PATIENT ASSISTANCE FOR AN BRING BACK ASAP FOR TO FINISH AND FAX TO COMPANY  Follow-Up: Your next appointment:  3 month(s) In Person with 05/2019, MD or Monica Zahler, PA-C    At [facility], you and your health needs are our priority.  As part of our continuing mission to provide you with exceptional heart care, we have created designated Provider Care Teams.  These Care Teams include your primary Cardiologist (physician) and Advanced Practice Providers (APPs -  Physician Assistants and Nurse Practitioners) who all work together to provide you with the care you need, when you need it.       Signed, Thurmon Fair, PA-C  08/12/2021 12:38 PM    [facility]

## 2021-08-11 ENCOUNTER — Encounter: Payer: Self-pay | Admitting: Student

## 2021-08-12 ENCOUNTER — Other Ambulatory Visit: Payer: Self-pay | Admitting: Student

## 2021-08-12 ENCOUNTER — Ambulatory Visit: Payer: BC Managed Care – PPO | Admitting: Student

## 2021-08-12 ENCOUNTER — Encounter: Payer: Self-pay | Admitting: Student

## 2021-08-12 ENCOUNTER — Other Ambulatory Visit: Payer: Self-pay

## 2021-08-12 VITALS — BP 117/72 | HR 68 | Ht 70.0 in | Wt 197.6 lb

## 2021-08-12 DIAGNOSIS — E785 Hyperlipidemia, unspecified: Secondary | ICD-10-CM

## 2021-08-12 DIAGNOSIS — I252 Old myocardial infarction: Secondary | ICD-10-CM

## 2021-08-12 DIAGNOSIS — I25119 Atherosclerotic heart disease of native coronary artery with unspecified angina pectoris: Secondary | ICD-10-CM

## 2021-08-12 DIAGNOSIS — Z8673 Personal history of transient ischemic attack (TIA), and cerebral infarction without residual deficits: Secondary | ICD-10-CM

## 2021-08-12 DIAGNOSIS — Z79899 Other long term (current) drug therapy: Secondary | ICD-10-CM

## 2021-08-12 NOTE — Progress Notes (Signed)
error 

## 2021-08-12 NOTE — Patient Instructions (Signed)
Medication Instructions:  The current medical regimen is effective;  continue present plan and medications as directed. Please refer to the Current Medication list given to you today.  *If you need a refill on your cardiac medications before your next appointment, please call your pharmacy*  Lab Work: FASTIN LIPID AND LFT IN 2 MONTHS If you have labs (blood work) drawn today and your tests are completely normal, you will receive your results only by:  MyChart Message (if you have MyChart) OR A paper copy in the mail.  If you have any lab test that is abnormal or we need to change your treatment, we will call you to review the results. You may go to any [facility] that is convenient for you however, we do have a lab in our office that is able to assist you. You DO NOT need an appointment for our lab. The lab is open 8:00am and closes at 4:00pm. Lunch 12:45 - 1:45pm.  Testing/Procedures: NONE  Special Instructions PLEASE FILL OUT PATIENT ASSISTANCE FOR AN BRING BACK ASAP FOR [place] TO FINISH AND FAX TO COMPANY  Follow-Up: Your next appointment:  3 month(s) In Person with Thurmon Fair, MD or CALLIE GOODRICH, PA-C    At [facility], you and your health needs are our priority.  As part of our continuing mission to provide you with exceptional heart care, we have created designated Provider Care Teams.  These Care Teams include your primary Cardiologist (physician) and Advanced Practice Providers (APPs -  Physician Assistants and Nurse Practitioners) who all work together to provide you with the care you need, when you need it.

## 2021-08-19 DIAGNOSIS — M47816 Spondylosis without myelopathy or radiculopathy, lumbar region: Secondary | ICD-10-CM | POA: Diagnosis not present

## 2021-08-19 DIAGNOSIS — M48062 Spinal stenosis, lumbar region with neurogenic claudication: Secondary | ICD-10-CM | POA: Diagnosis not present

## 2021-08-19 DIAGNOSIS — M545 Low back pain, unspecified: Secondary | ICD-10-CM | POA: Diagnosis not present

## 2021-09-22 DIAGNOSIS — N400 Enlarged prostate without lower urinary tract symptoms: Secondary | ICD-10-CM | POA: Diagnosis not present

## 2021-09-22 DIAGNOSIS — D539 Nutritional anemia, unspecified: Secondary | ICD-10-CM | POA: Diagnosis not present

## 2021-09-22 DIAGNOSIS — E7800 Pure hypercholesterolemia, unspecified: Secondary | ICD-10-CM | POA: Diagnosis not present

## 2021-09-23 DIAGNOSIS — N400 Enlarged prostate without lower urinary tract symptoms: Secondary | ICD-10-CM | POA: Diagnosis not present

## 2021-09-23 DIAGNOSIS — N529 Male erectile dysfunction, unspecified: Secondary | ICD-10-CM | POA: Diagnosis not present

## 2021-09-23 DIAGNOSIS — E7800 Pure hypercholesterolemia, unspecified: Secondary | ICD-10-CM | POA: Diagnosis not present

## 2021-09-23 DIAGNOSIS — I6381 Other cerebral infarction due to occlusion or stenosis of small artery: Secondary | ICD-10-CM | POA: Diagnosis not present

## 2021-09-23 DIAGNOSIS — Z0000 Encounter for general adult medical examination without abnormal findings: Secondary | ICD-10-CM | POA: Diagnosis not present

## 2021-11-02 NOTE — Progress Notes (Deleted)
Cardiology Office Note:    Date:  11/02/2021   ID:  Peter Dominguez, DOB 04-16-1956, MRN [id number]  PCP:  Katherine Basset, PA-C  Cardiologist:  Thurmon Fair, MD  Electrophysiologist:  None   Referring MD: Katherine Basset, PA-C   Chief Complaint: follow-up of CAD  History of Present Illness:    Peter Dominguez is a 66 y.o. 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 Royann Shivers and presents today for follow-up of CAD.   Patient was recently admitted from 07/24/2021 to 07/26/2021 for NSTEMI after presenting with left arm pain. EKG showed new T wave inversions in inferior leads. High-sensitivity troponin peaked at 656. Echo showed LVEF of 65-70% with normal wall motion and grade 2 diastolic dysfunction. Left cardiac catheterization showed severe single vessel CAD with 70% stenosis of the proximal RCA and 95% stenosis of the mid RCA. Patient underwent successful PCI with DES covering both the proximal and mid RCA lesions. Patient was started on DAPT with Aspirin and Brilinta as well as a beta-blocker and high-intensity statin. Of note, Triglycerides were severely elevated at 463 and direct LDL was 144. Home Crestor 10mg  daily was stopped and he was started on Lipitor 80mg  daily instead. He was last seen by me on 08/12/2021 at which time he was recovering well.  Patient presents today for follow-up. ***  CAD History of NSTEMI in 07/2021 s/p DES to proximal to mid RCA. - *** - Continue DAPT with Aspirin and Brilinta. - Continue Coreg 3.125mg  twice daily and Lipitor 80mg  daily.    Hyperlipidemia Recent lipid panel in 09/2021 in Care Everywhere: Total Cholesterol 169, Triglycerides 101, HDL 35, LDL 118. LDL goal <70 given CAD. - Continue Lipitor 80mg   daily. - Will add Zetia 10mg  daily but suspect this will not get him to goal. Will go ahead and refer to lipid clinic. 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 - Continue antiplatelet and stain therapy as above.  Past Medical History:  Diagnosis Date   CAD (coronary artery disease)    History of non-ST elevation myocardial infarction (NSTEMI)    Hyperlipidemia    Stroke [facility])     Past Surgical History:  Procedure Laterality Date   CORONARY STENT INTERVENTION N/A 07/25/2021   Procedure: CORONARY STENT INTERVENTION;  Surgeon: , MD;  Location: [facility] INVASIVE CV LAB;  Service: Cardiovascular;  Laterality: N/A;   LEFT HEART CATH AND CORONARY ANGIOGRAPHY N/A 07/25/2021   Procedure: LEFT HEART CATH AND CORONARY ANGIOGRAPHY;  Surgeon: [facility], MD;  Location: [facility] INVASIVE CV LAB;  Service: Cardiovascular;  Laterality: N/A;    Current Medications: No outpatient medications have been marked as taking for the 11/14/21 encounter (Appointment) with [facility], PA-C.     Allergies:   Bupropion   Social History   Socioeconomic History   Marital status: Married    Spouse name: Not on file   Number of children: Not on file   Years of education: Not on file   Highest education level: Not on file  Occupational History   Not on file  Tobacco Use   Smoking status: Former    Types: Cigarettes    Quit date: 05/13/2019  Years since quitting: 2.4   Smokeless tobacco: Never  Substance and Sexual Activity   Alcohol use: Yes    Alcohol/week: 1.0 standard drink    Types: 1 Shots of liquor per week    Comment: social    Drug use: No   Sexual activity: Not on file  Other Topics Concern   Not on file  Social History Narrative   Not on file   Social Determinants of Health   Financial Resource Strain: Not on file  Food Insecurity: Not on file  Transportation Needs: Not on  file  Physical Activity: Not on file  Stress: Not on file  Social Connections: Not on file     Family History: The patient's family history is negative for Heart disease and Stroke.  ROS:   Please see the history of present illness.     EKGs/Labs/Other Studies Reviewed:    The following studies were reviewed today:  Echocardiogram 07/24/2021: Impressions: 1. Left ventricular ejection fraction, by estimation, is 65 to 70%. The  left ventricle has hyperdynamic function. The left ventricle has no  regional wall motion abnormalities. There is mild concentric left  ventricular hypertrophy. Left ventricular  diastolic parameters are consistent with Grade II diastolic dysfunction  (pseudonormalization).   2. Right ventricular systolic function is normal. The right ventricular  size is normal. There is normal pulmonary artery systolic pressure.   3. Left atrial size was mildly dilated.   4. The mitral valve is normal in structure. No evidence of mitral valve  regurgitation. No evidence of mitral stenosis.   5. The aortic valve is normal in structure. Aortic valve regurgitation is  not visualized. No aortic stenosis is present.   6. The inferior vena cava is normal in size with greater than 50%  respiratory variability, suggesting right atrial pressure of 3 mmHg.   Comparison(s): Prior images reviewed side by side. The left ventricular  diastolic function is significantly worse.  _______________  Left Cardiac Catheterization 07/25/2021:   Prox RCA lesion is 70% stenosed.  (CULPRIT LESION) Prox RCA to Mid RCA lesion is 95% stenosed.   A drug-eluting stent was successfully placed covering both lesions, using a STENT ONYX FRONTIER [id number].  Postdilated from 2.9-3.1 distal to proximal.   Mid RCA lesion is 30% stenosed.   Minimal CAD noted in the left system.   ---------------------------------------------   The left ventricular systolic function is normal.  The left ventricular ejection  fraction is greater than 65% by visual estimate   LV end diastolic pressure is normal.   There is no aortic valve stenosis.   There is no mitral valve regurgitation.   Summary: Severe Singel Vessel CAD: RCA prox 70% & culprit 95% mid (TIMI II flow) Successful DES PCI of the proximal and mid RCA covering both lesions with Onyx frontier DES 2.75 mm at 30 mm postdilated 3.1 to 2.9 mm);  lesions reduced to 5% and 0% post PCI with TIMI-3 flow pre and post.  TIMI-3 flow Normal Left Coronary System. Normal LVEF with no regional wall motion abnormality.  Mildly elevated LVEDP   Recommendations: Transfer to 6 E. postprocedural unit.  TR band removal per protocol. Continue to titrate Guideline Directed Medical Therapy for ACS/CAD -> high-dose statin, beta-blocker +/- ARB/ACE inhibitor. DAPT x1 year  Diagnostic Dominance: Right Intervention    EKG:  EKG not ordered today.   Recent Labs: 07/24/2021: TSH 2.271 07/26/2021: BUN 12; Creatinine, Ser 0.97; Hemoglobin 12.5; Platelets 200; Potassium 5.1; Sodium 134  Recent Lipid  Panel    Component Value Date/Time   CHOL 220 (H) 07/24/2021 0030   TRIG 463 (H) 07/24/2021 0030   HDL 36 (L) 07/24/2021 0030   CHOLHDL 6.1 07/24/2021 0030   VLDL UNABLE TO CALCULATE IF TRIGLYCERIDE OVER 400 mg/dL 16/10/960410/06/2021 54090030   LDLCALC UNABLE TO CALCULATE IF TRIGLYCERIDE OVER 400 mg/dL 81/19/147810/06/2021 29560030   LDLDIRECT 144.6 (H) 07/24/2021 0030    Physical Exam:    Vital Signs: There were no vitals taken for this visit.    Wt Readings from Last 3 Encounters:  08/12/21 197 lb 9.6 oz (89.6 kg)  07/24/21 192 lb 4.8 oz (87.2 kg)  12/03/19 203 lb 6.4 oz (92.3 kg)     General: 66 y.o. male in no acute distress. HEENT: Normocephalic and atraumatic. Sclera clear. EOMs intact. Neck: Supple. No carotid bruits. No JVD. Heart: *** RRR. Distinct S1 and S2. No murmurs, gallops, or rubs. Radial and distal pedal pulses 2+ and equal bilaterally. Lungs: No increased work of  breathing. Clear to ausculation bilaterally. No wheezes, rhonchi, or rales.  Abdomen: Soft, non-distended, and non-tender to palpation. Bowel sounds present in all 4 quadrants.  MSK: Normal strength and tone for age. *** Extremities: No lower extremity edema.    Skin: Warm and dry. Neuro: Alert and oriented x3. No focal deficits. Psych: Normal affect. Responds appropriately.   Assessment:    No diagnosis found.  Plan:     Disposition: Follow up in ***   Medication Adjustments/Labs and Tests Ordered: Current medicines are reviewed at length with the patient today.  Concerns regarding medicines are outlined above.  No orders of the defined types were placed in this encounter.  No orders of the defined types were placed in this encounter.   There are no Patient Instructions on file for this visit.   Signed, Corrin ParkerCallie E Ilir Mahrt, PA-C  11/02/2021 2:10 PM    [facility]

## 2021-11-14 ENCOUNTER — Ambulatory Visit: Payer: BC Managed Care – PPO | Admitting: Student

## 2021-12-23 DIAGNOSIS — M48062 Spinal stenosis, lumbar region with neurogenic claudication: Secondary | ICD-10-CM | POA: Diagnosis not present

## 2021-12-23 DIAGNOSIS — M47816 Spondylosis without myelopathy or radiculopathy, lumbar region: Secondary | ICD-10-CM | POA: Diagnosis not present

## 2022-01-30 DIAGNOSIS — M47816 Spondylosis without myelopathy or radiculopathy, lumbar region: Secondary | ICD-10-CM | POA: Diagnosis not present

## 2022-02-13 DIAGNOSIS — M47816 Spondylosis without myelopathy or radiculopathy, lumbar region: Secondary | ICD-10-CM | POA: Diagnosis not present

## 2022-03-07 ENCOUNTER — Other Ambulatory Visit: Payer: Self-pay | Admitting: Home Health

## 2022-03-17 DIAGNOSIS — M47816 Spondylosis without myelopathy or radiculopathy, lumbar region: Secondary | ICD-10-CM | POA: Diagnosis not present

## 2022-03-17 DIAGNOSIS — M48062 Spinal stenosis, lumbar region with neurogenic claudication: Secondary | ICD-10-CM | POA: Diagnosis not present

## 2022-03-23 DIAGNOSIS — D539 Nutritional anemia, unspecified: Secondary | ICD-10-CM | POA: Diagnosis not present

## 2022-03-23 DIAGNOSIS — E7800 Pure hypercholesterolemia, unspecified: Secondary | ICD-10-CM | POA: Diagnosis not present

## 2022-03-24 DIAGNOSIS — D539 Nutritional anemia, unspecified: Secondary | ICD-10-CM | POA: Diagnosis not present

## 2022-03-24 DIAGNOSIS — N529 Male erectile dysfunction, unspecified: Secondary | ICD-10-CM | POA: Diagnosis not present

## 2022-03-24 DIAGNOSIS — N400 Enlarged prostate without lower urinary tract symptoms: Secondary | ICD-10-CM | POA: Diagnosis not present

## 2022-03-24 DIAGNOSIS — E7800 Pure hypercholesterolemia, unspecified: Secondary | ICD-10-CM | POA: Diagnosis not present

## 2022-04-03 DIAGNOSIS — M545 Low back pain, unspecified: Secondary | ICD-10-CM | POA: Diagnosis not present

## 2022-04-03 DIAGNOSIS — M48061 Spinal stenosis, lumbar region without neurogenic claudication: Secondary | ICD-10-CM | POA: Diagnosis not present

## 2022-04-03 DIAGNOSIS — M48062 Spinal stenosis, lumbar region with neurogenic claudication: Secondary | ICD-10-CM | POA: Diagnosis not present

## 2022-04-07 DIAGNOSIS — M47816 Spondylosis without myelopathy or radiculopathy, lumbar region: Secondary | ICD-10-CM | POA: Diagnosis not present

## 2022-04-07 DIAGNOSIS — M48062 Spinal stenosis, lumbar region with neurogenic claudication: Secondary | ICD-10-CM | POA: Diagnosis not present

## 2022-05-02 DIAGNOSIS — M48062 Spinal stenosis, lumbar region with neurogenic claudication: Secondary | ICD-10-CM | POA: Diagnosis not present

## 2022-05-26 DIAGNOSIS — M48062 Spinal stenosis, lumbar region with neurogenic claudication: Secondary | ICD-10-CM | POA: Diagnosis not present

## 2022-06-23 DIAGNOSIS — Z79891 Long term (current) use of opiate analgesic: Secondary | ICD-10-CM | POA: Diagnosis not present

## 2022-06-23 DIAGNOSIS — G894 Chronic pain syndrome: Secondary | ICD-10-CM | POA: Diagnosis not present

## 2022-06-23 DIAGNOSIS — M48062 Spinal stenosis, lumbar region with neurogenic claudication: Secondary | ICD-10-CM | POA: Diagnosis not present

## 2022-06-23 DIAGNOSIS — M47816 Spondylosis without myelopathy or radiculopathy, lumbar region: Secondary | ICD-10-CM | POA: Diagnosis not present

## 2022-07-23 ENCOUNTER — Other Ambulatory Visit: Payer: Self-pay | Admitting: Student

## 2022-08-12 ENCOUNTER — Other Ambulatory Visit: Payer: Self-pay | Admitting: Cardiology

## 2022-08-25 DIAGNOSIS — M48062 Spinal stenosis, lumbar region with neurogenic claudication: Secondary | ICD-10-CM | POA: Diagnosis not present

## 2022-09-19 ENCOUNTER — Other Ambulatory Visit: Payer: Self-pay | Admitting: Cardiology

## 2022-09-25 DIAGNOSIS — E7800 Pure hypercholesterolemia, unspecified: Secondary | ICD-10-CM | POA: Diagnosis not present

## 2022-09-26 DIAGNOSIS — I25110 Atherosclerotic heart disease of native coronary artery with unstable angina pectoris: Secondary | ICD-10-CM | POA: Diagnosis not present

## 2022-09-26 DIAGNOSIS — E7800 Pure hypercholesterolemia, unspecified: Secondary | ICD-10-CM | POA: Diagnosis not present

## 2022-09-26 DIAGNOSIS — Z0000 Encounter for general adult medical examination without abnormal findings: Secondary | ICD-10-CM | POA: Diagnosis not present

## 2022-09-26 DIAGNOSIS — I252 Old myocardial infarction: Secondary | ICD-10-CM | POA: Diagnosis not present

## 2022-09-26 DIAGNOSIS — N529 Male erectile dysfunction, unspecified: Secondary | ICD-10-CM | POA: Diagnosis not present

## 2022-09-30 ENCOUNTER — Other Ambulatory Visit: Payer: Self-pay | Admitting: Home Health

## 2022-10-19 ENCOUNTER — Other Ambulatory Visit: Payer: Self-pay | Admitting: Cardiology

## 2022-11-07 LAB — COLOGUARD: COLOGUARD: NEGATIVE

## 2022-11-07 LAB — EXTERNAL GENERIC LAB PROCEDURE: COLOGUARD: NEGATIVE

## 2023-02-19 ENCOUNTER — Other Ambulatory Visit: Payer: Self-pay | Admitting: Cardiology

## 2024-01-30 ENCOUNTER — Encounter: Payer: Self-pay | Admitting: Dermatology

## 2024-01-30 ENCOUNTER — Other Ambulatory Visit: Payer: Self-pay | Admitting: Dermatology

## 2024-01-30 ENCOUNTER — Ambulatory Visit (INDEPENDENT_AMBULATORY_CARE_PROVIDER_SITE_OTHER): Admitting: Dermatology

## 2024-01-30 DIAGNOSIS — R21 Rash and other nonspecific skin eruption: Secondary | ICD-10-CM

## 2024-01-30 DIAGNOSIS — L308 Other specified dermatitis: Secondary | ICD-10-CM

## 2024-01-30 MED ORDER — ZORYVE 0.3 % EX CREA
TOPICAL_CREAM | CUTANEOUS | 2 refills | Status: AC
Start: 2024-01-30 — End: ?

## 2024-01-30 NOTE — Progress Notes (Signed)
 New Patient Visit   Subjective  Peter Dominguez is a 68 y.o. male who presents for the following: New Pt - Dermatitis  Patient states he  has dermatitis located at the scattered that he  would like to have examined. Patient reports the areas have been there for 1.5 years. He reports the areas are bothersome.Patient rates irritation (itching)10 out of 10. He states that the areas have spread starting at lower back then to other places. Patient reports he  has previously been treated for these areas.  [name], a patient with a history of heart attack, stroke, and sciatic nerve damage, presents with a severe itchy rash diagnosed as eczema / atopic dermatitis. The rash started about a year and a half ago on his lower back and has since spread to his stomach and arms.  The patient reports that the rash is dry and scaly, and when not on prednisone, it covers his entire body. He rates the itch as a 4 or 5 out of 10 while on prednisone, noting that it never reaches 0 even with medication. The patient has tried various topical steroid creams (mometason, betamethasone, pimecrolimus, topical diclfinac) treatments without success. He was prescribed methotrexate, which caused severe fatigue, and CellCept, which he stopped due to chest pain, stomach issues, and worsening of the rash. He also experienced irregular heartbeats with CellCept.  Currently, the patient is taking 30 mg of prednisone daily (3 pills of 10 mg each) against medical advice. He acknowledges side effects from prolong prednisone use (he's been on many long tapers over the past 1.5 years), including weight gain and facial swelling. The rash on his calves is described as "pretty stable right now." He reports constant joint pain.  Regarding his cardiovascular health, the patient had a heart attack in 2021 and received a stent in 2022. 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 He sees a cardiologist annually and reports normal  EKGs. The patient is on aspirin and BP medications  for his cardiovascular conditions.  The patient has made lifestyle changes, including eliminating pork from his diet for the past 3 years, reducing red meat intake to once a week, and avoiding salt for over 20 years. He now primarily consumes chicken, fish, and [place].  The following portions of the chart were reviewed this encounter and updated as appropriate: medications, allergies, medical history  Review of Systems:  No other skin or systemic complaints except as noted in HPI or Assessment and Plan.  Objective  Well appearing patient in no apparent distress; mood and affect are within normal limits.   A focused examination was performed of the following areas: scattered   Relevant exam findings are noted in the Assessment and Plan.                     Assessment & Plan   Psoriasiform Dermatitis Exam: Scaly erythematous plaques located at L flank, R lower back, B/L forearms  10% BSA  Flared  - Assessment: Patient presents with a severe itchy rash, previously diagnosed as eczema or atopic dermatitis. Started about 1.5 years ago on lower back, spread to stomach and arms. Characterized by dry, scaly patches. Stable on legs but widespread when not on prednisone. Multiple topical treatments ineffective. Currently managed with prednisone 30 mg daily, reducing itching to 4-5/10. Previous treatments: methotrexate (caused fatigue) and CellCept (caused chest pain, stomach issues, worsened rash). No skin biopsy performed. Differential diagnosis includes eczema and psoriasis.   -  Plan:    Perform punch biopsy of affected skin    Discontinue prednisone; patient to taper at their discretion    Prescribe Zoryve (roflumilast) cream 0.3% once daily, up to twice daily if needed     - Provide samples to start treatment     - Send prescription to [facility]    Educate patient on biopsy site care:     - Keep clean in shower     -  Apply Aquaphor or plain Vaseline and cover with Band-Aid daily     - Change bandage once a day or leave uncovered if bandage causes itching    Follow up in 2 weeks for suture removal, biopsy results discussion, and long-term treatment plan    If biopsy confirms eczema, consider Dupixent (dupilumab) as next treatment option    If biopsy confirms psoriasis, explore other oral medication options such as otezla or biologic  Pre-visit planning reviewing the last office visit, labs, imaging and care everywhere when applicable was 10 minutes Intra-visit was 30 minutes and included updating the relevant history, performing a video or in-person physical exam as appropriate, creating a treatment plan and used shared decision making with the patient.  Post-visit was 10 minutes that encompassed note completion, placing of orders, updating patient instructions and coordination of care.  RASH AND OTHER NONSPECIFIC SKIN ERUPTION   Related Medications Roflumilast (ZORYVE) 0.3 % CREA Apply to affected areas once a day. RASH Right Lower Back Skin / nail biopsy - Right Lower Back Type of biopsy: punch   Informed consent: discussed and consent obtained   Timeout: patient name, date of birth, surgical site, and procedure verified   Procedure prep:  Patient was prepped and draped in usual sterile fashion (the patient was cleaned and prepped) Prep type:  Isopropyl alcohol Anesthesia: the lesion was anesthetized in a standard fashion   Anesthetic:  1% lidocaine w/ epinephrine 1-100,000 buffered w/ 8.4% NaHCO3 Punch size:  4 mm Suture size:  4-0 Suture type: Prolene (polypropylene)   Hemostasis achieved with: suture, pressure and aluminum chloride   Outcome: patient tolerated procedure well   Post-procedure details: sterile dressing applied and wound care instructions given   Dressing type: bandage, petrolatum and pressure dressing    No follow-ups on file.    Documentation: I have reviewed the above  documentation for accuracy and completeness, and I agree with the above.   I, Shirron Louanne Roussel, CMA, am acting as scribe for [employer], DO.   Louana Roup, DO

## 2024-01-30 NOTE — Patient Instructions (Addendum)
 Hello [name],  Thank you for visiting today. Here is a summary of the key instructions:  - Medications:   - We discussed the risk of over using oral Prednisone, I recommend you expedite the taper if you're going to continue taking it   - Use Zoryve cream once daily, can use twice if needed   - Apply Aquaphor or plain Vaseline to biopsy site after showering  - Procedures:   - Punch biopsy performed today to determine of the rash is cause bye eczema vs psoriasis   - Wound Care:   - Keep biopsy site clean   - Change bandage once a day   - If bandage causes itching, leave it off  - Follow-up:   - Return in 2 weeks for suture removal and to discuss biopsy results   - Long-term treatment plan will be determined at follow-up visit  - Pharmacy:   - Zoryve prescription sent to [facility]   - Pharmacy will call for insurance information  Please reach out if you have any questions or concerns.  Warm regards,  Dr. Louana Roup, Dermatology     Your prescription was sent to [facility] in [place]. A representative from [facility] will contact you within 3 business hours to verify your address and insurance information to schedule a free delivery. If for any reason you do not receive a phone call from them, please reach out to them. Their phone number is [phone] and their hours are Monday-Friday 9:00 am-5:00 pm.       Patient Handout: Wound Care for Skin Biopsy Site  Taking Care of Your Skin Biopsy Site  Proper care of the biopsy site is essential for promoting healing and minimizing scarring. This handout provides instructions on how to care for your biopsy site to ensure optimal recovery.  1. Cleaning the Wound:  Clean the biopsy site daily with gentle soap and water. Gently pat the area dry with a clean, soft towel. Avoid harsh scrubbing or rubbing the area, as this can irritate the skin and delay healing.  2. Applying Aquaphor and  Bandage:  After cleaning the wound, apply a thin layer of Aquaphor ointment to the biopsy site. Cover the area with a sterile bandage to protect it from dirt, bacteria, and friction. Change the bandage daily or as needed if it becomes soiled or wet.  3. Continued Care for One Week:  Repeat the cleaning, Aquaphor application, and bandaging process daily for one week following the biopsy procedure. Keeping the wound clean and moist during this initial healing period will help prevent infection and promote optimal healing.  4. Massaging Aquaphor into the Area:  ---After one week, discontinue the use of bandages but continue to apply Aquaphor to the biopsy site. ----Gently massage the Aquaphor into the area using circular motions. ---Massaging the skin helps to promote circulation and prevent the formation of scar tissue.   Additional Tips:  Avoid exposing the biopsy site to direct sunlight during the healing process, as this can cause hyperpigmentation or worsen scarring. If you experience any signs of infection, such as increased redness, swelling, warmth, or drainage from the wound, contact your healthcare provider immediately. Follow any additional instructions provided by your healthcare provider for caring for the biopsy site and managing any discomfort. Conclusion:  Taking proper care of your skin biopsy site is crucial for ensuring optimal healing and minimizing scarring. By following these instructions for cleaning, applying Aquaphor, and massaging the area, you can promote  a smooth and successful recovery. If you have any questions or concerns about caring for your biopsy site, don't hesitate to contact your healthcare provider for guidance.     Important Information  Due to recent changes in healthcare laws, you may see results of your pathology and/or laboratory studies on MyChart before the doctors have had a chance to review them. We understand that in some cases there may be  results that are confusing or concerning to you. Please understand that not all results are received at the same time and often the doctors may need to interpret multiple results in order to provide you with the best plan of care or course of treatment. Therefore, we ask that you please give [place] 2 business days to thoroughly review all your results before contacting the office for clarification. Should we see a critical lab result, you will be contacted sooner.   If You Need Anything After Your Visit  If you have any questions or concerns for your doctor, please call our main line at [phone] If no one answers, please leave a voicemail as directed and we will return your call as soon as possible. Messages left after 4 pm will be answered the following business day.   You may also send [place] a message via MyChart. We typically respond to MyChart messages within 1-2 business days.  For prescription refills, please ask your pharmacy to contact our office. Our fax number is [phone].  If you have an urgent issue when the clinic is closed that cannot wait until the next business day, you can page your doctor at the number below.    Please note that while we do our best to be available for urgent issues outside of office hours, we are not available 24/7.   If you have an urgent issue and are unable to reach [place], you may choose to seek medical care at your doctor's office, retail clinic, urgent care center, or emergency room.  If you have a medical emergency, please immediately call 911 or go to the emergency department. In the event of inclement weather, please call our main line at [phone] for an update on the status of any delays or closures.  Dermatology Medication Tips: Please keep the boxes that topical medications come in in order to help keep track of the instructions about where and how to use these. Pharmacies typically print the medication instructions only on the boxes and not  directly on the medication tubes.   If your medication is too expensive, please contact our office at [phone] or send [place] a message through MyChart.   We are unable to tell what your co-pay for medications will be in advance as this is different depending on your insurance coverage. However, we may be able to find a substitute medication at lower cost or fill out paperwork to get insurance to cover a needed medication.   If a prior authorization is required to get your medication covered by your insurance company, please allow [place] 1-2 business days to complete this process.  Drug prices often vary depending on where the prescription is filled and some pharmacies may offer cheaper prices.  The website www.goodrx.com contains coupons for medications through different pharmacies. The prices here do not account for what the cost may be with help from insurance (it may be cheaper with your insurance), but the website can give you the price if you did not use any insurance.  - You can print the associated coupon  and take it with your prescription to the pharmacy.  - You may also stop by our office during regular business hours and pick up a GoodRx coupon card.  - If you need your prescription sent electronically to a different pharmacy, notify our office through [facility] or by phone at [phone]

## 2024-02-05 ENCOUNTER — Other Ambulatory Visit: Payer: Self-pay

## 2024-02-05 MED ORDER — CALCIPOTRIENE 0.005 % EX CREA: TOPICAL_CREAM | Freq: Two times a day (BID) | CUTANEOUS | 3 refills | Status: AC

## 2024-02-06 LAB — DERMATOLOGY PATHOLOGY

## 2024-02-13 ENCOUNTER — Encounter: Payer: Self-pay | Admitting: Dermatology

## 2024-02-13 ENCOUNTER — Ambulatory Visit (INDEPENDENT_AMBULATORY_CARE_PROVIDER_SITE_OTHER): Admitting: Dermatology

## 2024-02-13 VITALS — BP 114/76 | HR 81

## 2024-02-13 DIAGNOSIS — L308 Other specified dermatitis: Secondary | ICD-10-CM

## 2024-02-13 DIAGNOSIS — L209 Atopic dermatitis, unspecified: Secondary | ICD-10-CM

## 2024-02-13 MED ORDER — DUPILUMAB 300 MG/2ML ~~LOC~~ SOSY
600.0000 mg | PREFILLED_SYRINGE | Freq: Once | SUBCUTANEOUS | Status: AC
  Administered 2024-02-13: 600 mg via SUBCUTANEOUS

## 2024-02-13 MED ORDER — DUPIXENT 300 MG/2ML ~~LOC~~ SOAJ: 300.0000 mg | SUBCUTANEOUS | 11 refills | Status: AC

## 2024-02-13 NOTE — Progress Notes (Signed)
 Follow-Up Visit   Subjective  Peter Dominguez is a 68 y.o. male who presents for the following: Bx Results (Eczema)  Patient present today for follow up visit for Bx Results. Patient was last evaluated on 01/30/23. At this visit patient was prescribed Calcipotriene  and Zoryve . Patient Patient reports sxs are unchanged. Patient denies medication changes. Patient reports his itch today is rated 10 out of 10.  The following portions of the chart were reviewed this encounter and updated as appropriate: medications, allergies, medical history  Review of Systems:  No other skin or systemic complaints except as noted in HPI or Assessment and Plan.  Objective  Well appearing patient in no apparent distress; mood and affect are within normal limits.  A full examination was performed including scalp, head, eyes, ears, nose, lips, neck, chest, axillae, abdomen, back, buttocks, bilateral upper extremities, bilateral lower extremities, hands, feet, fingers, toes, fingernails, and toenails. All findings within normal limits unless otherwise noted below.   Relevant exam findings are noted in the Assessment and Plan.                          FINAL DIAGNOSIS and MICROSCOPIC DESCRIPTION Diagnosis Skin , right lower back SPONGIOTIC DERMATITIS, SEE DESCRIPTION Microscopic Description There is a superficial perivascular inflammatory infiltrate composed predominantly of lymphocytes. Lymphocytes extend to the epidermis where there is spongiosis and overlying parakeratosis. Following review of the hematoxylin and eosin sections, a PAS stain was obtained to exclude a fungal infection. The PAS stain is negative for fungal organisms. The findings are most consistent with an eczematous dermatitis such as contact or nummular dermatitis.   Assessment & Plan   ATOPIC DERMATITIS Exam: Scaly pink papules coalescing to plaques, 15% BSA IGA: 3  - Assessment: Patient diagnosed with eczema, confirmed by  biopsy. Worsening symptoms with large erythematous plaques and some scale on back, arms, and calves. Body surface area (BSA) involvement of 15% and IJ scores of 3. Previous treatments (tacrolimus, clobetasol, caspitrine, and Zoryve ) ineffective. Prednisone  taper from 20 mg to 10 mg daily has not provided adequate control.  Flared  Atopic dermatitis (eczema) is a chronic, relapsing, pruritic condition that can significantly affect quality of life. It is often associated with allergic rhinitis and/or asthma and can require treatment with topical medications, phototherapy, or in severe cases biologic injectable medication (Dupixent; Adbry) or Oral JAK inhibitors.  Treatment Plan: -Reviewed bx results which confirmed a diagnosis of atopic dermatitis - Patient initiated on Dupixent Therapy while in office today, our office will supply samples until patient gets approved - Recommend gentle skin care - Advised to continue the topical medications until - We will plan to follow up in 3 Months  Plan: Dupixent Initiation Indications:  Patient isn't a candidate for systemic therapy with methotrexate or cyclosporine. Patient has been unresponsive to aggressive topical therapy.  Failed Treatments: Topical Steroids and Topical Protopic  Treatment Protocol: 600 mg [place] day 0 then 300 mg [place] every other week  Specific Contraindications Cyclosporine is contraindicated because the patient will not be able to complete the necessary follow-up labs. Methotrexate is contraindicated because the patient will not be able to complete the necessary follow-up labs. Phototherapy is contraindicated because the patient lives too far from the treatment location.   Dupixent Counseling: I discussed with the patient the risks of dupilumab including but not limited to eye infection and irritation, cold sores, injection site reactions, worsening of asthma, allergic reactions and increased risk of parasitic  infection. Live  vaccines should be avoided while taking dupilumab. Dupilumab will also interact with certain medications such as warfarin and cyclosporine. The patient understands that monitoring is required and they must alert us  or the primary physician if symptoms of infection or other concerning signs are noted.  Dupixent Monitoring: There is no laboratory monitoring requirement with Dupixent.   ATOPIC DERMATITIS, UNSPECIFIED TYPE   Related Medications dupilumab (DUPIXENT) prefilled syringe 600 mg  Dupilumab (DUPIXENT) 300 MG/2ML SOAJ Inject 300 mg into the skin every 14 (fourteen) days. Starting at day 15 for maintenance.  Return in about 3 months (around 05/14/2024) for Eczema F/U.  I, Jetta Ager, am acting as Neurosurgeon for [employer], DO.  Documentation: I have reviewed the above documentation for accuracy and completeness, and I agree with the above.  Louana Roup, DO

## 2024-02-13 NOTE — Patient Instructions (Addendum)
 Date: Wed Feb 13 2024  Hello Peter Dominguez,  Thank you for visiting today. Here is a summary of the key instructions:  - Medications: Continue taking Olumiant, one 4 mg tablet daily   - Use clobetasol ointment if keloid area becomes itchy   - Apply for 2 weeks, then take a 2-week break   - Repeat this cycle to avoid side effects like skin thinning  - Follow-up: Return for a follow-up appointment in 4 months   - Compare pictures of hair regrowth at next visit   - Contact the office if any emergencies occur before the next appointment  - Pharmacy: Expect Olumiant to be delivered to your home from [facility]  Please reach out if you have any questions or concerns.  Warm regards,  Dr. Louana Roup, Dermatology        Important Information   Due to recent changes in healthcare laws, you may see results of your pathology and/or laboratory studies on MyChart before the doctors have had a chance to review them. We understand that in some cases there may be results that are confusing or concerning to you. Please understand that not all results are received at the same time and often the doctors may need to interpret multiple results in order to provide you with the best plan of care or course of treatment. Therefore, we ask that you please give us  2 business days to thoroughly review all your results before contacting the office for clarification. Should we see a critical lab result, you will be contacted sooner.     If You Need Anything After Your Visit   If you have any questions or concerns for your doctor, please call our main line at [phone]. If no one answers, please leave a voicemail as directed and we will return your call as soon as possible. Messages left after 4 pm will be answered the following business day.    You may also send us  a message via MyChart. We typically respond to MyChart messages within 1-2 business days.  For prescription refills, please ask  your pharmacy to contact our office. Our fax number is [phone].  If you have an urgent issue when the clinic is closed that cannot wait until the next business day, you can page your doctor at the number below.     Please note that while we do our best to be available for urgent issues outside of office hours, we are not available 24/7.    If you have an urgent issue and are unable to reach us , you may choose to seek medical care at your doctor's office, retail clinic, urgent care center, or emergency room.   If you have a medical emergency, please immediately call 911 or go to the emergency department. In the event of inclement weather, please call our main line at [phone] for an update on the status of any delays or closures.  Dermatology Medication Tips: Please keep the boxes that topical medications come in in order to help keep track of the instructions about where and how to use these. Pharmacies typically print the medication instructions only on the boxes and not directly on the medication tubes.   If your medication is too expensive, please contact our office at [phone] or send us  a message through MyChart.    We are unable to tell what your co-pay for medications will be in advance as this is different depending on your insurance coverage. However, we may be able  to find a substitute medication at lower cost or fill out paperwork to get insurance to cover a needed medication.    If a prior authorization is required to get your medication covered by your insurance company, please allow us  1-2 business days to complete this process.   Drug prices often vary depending on where the prescription is filled and some pharmacies may offer cheaper prices.   The website www.goodrx.com contains coupons for medications through different pharmacies. The prices here do not account for what the cost may be with help from insurance (it may be cheaper with your insurance), but the  website can give you the price if you did not use any insurance.  - You can print the associated coupon and take it with your prescription to the pharmacy.  - You may also stop by our office during regular business hours and pick up a GoodRx coupon card.  - If you need your prescription sent electronically to a different pharmacy, notify our office through [facility] or by phone at [phone]

## 2024-03-12 ENCOUNTER — Other Ambulatory Visit: Payer: Self-pay

## 2024-03-12 ENCOUNTER — Emergency Department (HOSPITAL_BASED_OUTPATIENT_CLINIC_OR_DEPARTMENT_OTHER)

## 2024-03-12 ENCOUNTER — Emergency Department (HOSPITAL_BASED_OUTPATIENT_CLINIC_OR_DEPARTMENT_OTHER)
Admission: EM | Admit: 2024-03-12 | Discharge: 2024-03-12 | Disposition: A | Discharge status: Home / Self Care | Attending: Emergency Medicine | Admitting: Emergency Medicine

## 2024-03-12 ENCOUNTER — Telehealth: Payer: Self-pay

## 2024-03-12 ENCOUNTER — Encounter (HOSPITAL_BASED_OUTPATIENT_CLINIC_OR_DEPARTMENT_OTHER): Payer: Self-pay | Admitting: Emergency Medicine

## 2024-03-12 DIAGNOSIS — R072 Precordial pain: Secondary | ICD-10-CM | POA: Insufficient documentation

## 2024-03-12 DIAGNOSIS — Z955 Presence of coronary angioplasty implant and graft: Secondary | ICD-10-CM | POA: Diagnosis not present

## 2024-03-12 DIAGNOSIS — Z7982 Long term (current) use of aspirin: Secondary | ICD-10-CM | POA: Diagnosis not present

## 2024-03-12 DIAGNOSIS — R079 Chest pain, unspecified: Secondary | ICD-10-CM

## 2024-03-12 DIAGNOSIS — Z8673 Personal history of transient ischemic attack (TIA), and cerebral infarction without residual deficits: Secondary | ICD-10-CM | POA: Insufficient documentation

## 2024-03-12 LAB — CBC
HCT: 37.6 % — ABNORMAL LOW (ref 39.0–52.0)
Hemoglobin: 12.6 g/dL — ABNORMAL LOW (ref 13.0–17.0)
MCH: 30.9 pg (ref 26.0–34.0)
MCHC: 33.5 g/dL (ref 30.0–36.0)
MCV: 92.2 fL (ref 80.0–100.0)
Platelets: 311 10*3/uL (ref 150–400)
RBC: 4.08 MIL/uL — ABNORMAL LOW (ref 4.22–5.81)
RDW: 14.3 % (ref 11.5–15.5)
WBC: 5.1 10*3/uL (ref 4.0–10.5)
nRBC: 0 % (ref 0.0–0.2)

## 2024-03-12 LAB — TROPONIN T, HIGH SENSITIVITY: Troponin T High Sensitivity: <15 ng/L (ref <19)

## 2024-03-12 LAB — BASIC METABOLIC PANEL WITH GFR
Anion gap: 11 (ref 5–15)
BUN: 8 mg/dL (ref 8–23)
CO2: 22 mmol/L (ref 22–32)
Calcium: 9.7 mg/dL (ref 8.9–10.3)
Chloride: 101 mmol/L (ref 98–111)
Creatinine, Ser: 0.76 mg/dL (ref 0.61–1.24)
GFR, Estimated: >60 mL/min (ref >60)
Glucose, Bld: 128 mg/dL — ABNORMAL HIGH (ref 70–99)
Potassium: 3.9 mmol/L (ref 3.5–5.1)
Sodium: 134 mmol/L — ABNORMAL LOW (ref 135–145)

## 2024-03-12 MED ORDER — LIDOCAINE VISCOUS HCL 2 % MT SOLN
15.0000 mL | Freq: Once | OROMUCOSAL | Status: AC
  Administered 2024-03-12: 15 mL via ORAL
  Filled 2024-03-12: qty 15

## 2024-03-12 MED ORDER — SAFETY SEAL MISCELLANEOUS MISC: 1.0000 | Freq: Two times a day (BID) | 4 refills | Status: DC

## 2024-03-12 MED ORDER — ALUM & MAG HYDROXIDE-SIMETH 200-200-20 MG/5ML PO SUSP
30.0000 mL | Freq: Once | ORAL | Status: AC
  Administered 2024-03-12: 30 mL via ORAL
  Filled 2024-03-12: qty 30

## 2024-03-12 NOTE — Telephone Encounter (Signed)
 Hi Hollie,  As per out disucssion in clinic.  Pt was advised to STOP dupixent .  While this is no a known of common side effect he should still stop.  He needs to be evaluated by his cardiologist or urgent care for an EKG immediately and not wait for his wife if he's still symptomatic I do NOT recommend he resume prednisone . I will see him at his follow up in a few weeks to discuss other nonsteroidal systemic options.  -Dr. Myrtie Atkinson

## 2024-03-12 NOTE — ED Triage Notes (Signed)
 Mid chest pain x 3 days , loss of appetite . No Hx GERD, no HTN . Hx stroke 2020.

## 2024-03-12 NOTE — ED Provider Notes (Signed)
 [facility] Provider Note   CSN: [id number] Arrival date & time: 03/12/24  1804     History  Chief Complaint  Patient presents with   Chest Pain    Peter Dominguez is a 68 y.o. male.  With a history of NSTEMI status post DES and ischemic stroke who presents to the ED for chest discomfort.  Patient recently seen by dermatologist and was started on Dupixent  for suspected psoriasis.  He has had this rash over his back and arms for 2 years.  Last dose of Dupixent  was 2 weeks ago.  Since that time he has experienced malaise, decreased appetite, fatigue.  He also notes substernal chest pain and epigastric pain.  Patient reports pain in his right lower extremity and right upper extremity as well.  No focal weakness or sensory deficits in these extremities.  Denies fevers chills coughing shortness of breath.   Chest Pain      Home Medications Prior to Admission medications   Medication Sig Start Date End Date Taking? Authorizing Provider  aspirin  EC 81 MG EC tablet Take 1 tablet (81 mg total) by mouth daily. 05/10/19   Verlyn Goad, MD  atorvastatin  (LIPITOR ) 80 MG tablet Take 1 tablet (80 mg total) by mouth daily. 07/27/21   Zhao, Xika, NP  calcipotriene  (DOVONOX) 0.005 % cream Apply topically 2 (two) times daily. Apply to affected areas twice daily 02/05/24   Dellar Fenton, DO  carvedilol  (COREG ) 3.125 MG tablet Take 1 tablet (3.125 mg total) by mouth 2 (two) times daily with a meal. 07/26/21   Zhao, Xika, NP  clotrimazole-betamethasone (LOTRISONE) cream Apply 1 application topically 2 (two) times daily as needed for rash. 03/25/21   [provider]  divalproex  (DEPAKOTE  ER) 500 MG 24 hr tablet Take 1,000 mg by mouth daily. 05/25/21   [provider]  Dupilumab  (DUPIXENT ) 300 MG/2ML SOAJ Inject 300 mg into the skin every 14 (fourteen) days. Starting at day 15 for maintenance. 02/13/24   Dellar Fenton, DO  Lacosamide  100 MG  TABS Take 0.5 tablets by mouth 2 (two) times daily. 05/27/21   [provider]  nitroGLYCERIN  (NITROSTAT ) 0.4 MG SL tablet PLACE 1 TABLET UNDER THE TONGUE EVERY 5 MINUTES X 3 DOSES AS NEEDED FOR CHEST PAIN. 02/19/23   Goodrich, Callie E, PA-C  Roflumilast  (ZORYVE ) 0.3 % CREA Apply to affected areas once a day. 01/30/24   Dellar Fenton, DO  Safety Seal Miscellaneous MISC Apply 1 application  topically 2 (two) times daily. Medication Name - Blanchard Bunk 03/12/24   Dellar Fenton, DO  ticagrelor  (BRILINTA ) 90 MG TABS tablet Take 1 tablet (90 mg total) by mouth 2 (two) times daily. 07/26/21   Zhao, Xika, NP  ticagrelor  (BRILINTA ) 90 MG TABS tablet TAKE 1 TABLET BY MOUTH TWICE A DAY 10/19/22   Arleen Lacer, MD      Allergies    Bupropion    Review of Systems   Review of Systems  Cardiovascular:  Positive for chest pain.    Physical Exam Updated Vital Signs BP 120/68 (BP Location: Right Arm)   Pulse 78   Temp 98.2 F (36.8 C) (Oral)   Resp 18   Wt 86.2 kg   SpO2 99%   BMI 27.26 kg/m  Physical Exam Vitals and nursing note reviewed.  HENT:     Head: Normocephalic and atraumatic.  Eyes:     Pupils: Pupils are equal, round, and reactive  to light.  Cardiovascular:     Rate and Rhythm: Normal rate and regular rhythm.  Pulmonary:     Effort: Pulmonary effort is normal.     Breath sounds: Normal breath sounds.  Abdominal:     Palpations: Abdomen is soft.     Tenderness: There is no abdominal tenderness.  Musculoskeletal:     Right lower leg: No edema.     Left lower leg: No edema.  Skin:    General: Skin is warm and dry.  Neurological:     General: No focal deficit present.     Mental Status: He is alert.     Motor: No weakness.     Comments: No sensory deficit  Psychiatric:        Mood and Affect: Mood normal.     ED Results / Procedures / Treatments   Labs (all labs ordered are listed, but only abnormal results are displayed) Labs Reviewed  BASIC  METABOLIC PANEL WITH GFR - Abnormal; Notable for the following components:      Result Value   Sodium 134 (*)    Glucose, Bld 128 (*)    All other components within normal limits  CBC - Abnormal; Notable for the following components:   RBC 4.08 (*)    Hemoglobin 12.6 (*)    HCT 37.6 (*)    All other components within normal limits  TROPONIN T, HIGH SENSITIVITY  TROPONIN T, HIGH SENSITIVITY    EKG EKG Interpretation Date/Time:  Wednesday Mar 12 2024 18:14:22 EDT Ventricular Rate:  77 PR Interval:  134 QRS Duration:  91 QT Interval:  362 QTC Calculation: 410 R Axis:   70  Text Interpretation: Sinus rhythm RSR' in V1 or V2, right VCD or RVH Minimal ST elevation, inferior leads Confirmed by Rafael Bun [phone]) on 03/12/2024 6:37:20 PM  Radiology DG Chest 2 View Result Date: 03/12/2024 CLINICAL DATA:  chest pain Mid chest pain x 3 days , loss of appetite. Pt had a stroke in 2020. EXAM: CHEST - 2 VIEW COMPARISON:  Chest x-ray 07/24/2021 FINDINGS: The heart and mediastinal contours are unchanged. Atherosclerotic plaque. Prominent hilar vasculature. Coronary artery stent. No focal consolidation. Slightly increased interstitial markings. No pleural effusion. No pneumothorax. No acute osseous abnormality. IMPRESSION: 1. Mild pulmonary edema. 2. Aortic Atherosclerosis (ICD10-I70.0). Electronically Signed   By: Morgane  Naveau M.D.   On: 03/12/2024 19:12    Procedures Procedures    Medications Ordered in ED Medications  alum & mag hydroxide-simeth (MAALOX/MYLANTA) 200-200-20 MG/5ML suspension 30 mL (30 mLs Oral Given 03/12/24 1921)    And  lidocaine  (XYLOCAINE ) 2 % viscous mouth solution 15 mL (15 mLs Oral Given 03/12/24 1921)    ED Course/ Medical Decision Making/ A&P Clinical Course as of 03/12/24 2028  Wed Mar 12, 2024  2025 High sensitive troponin less than 15.  Not consistent with ACS.  EKG shows no ischemic changes.  No significant metabolic abnormalities on BMP or derangements  on CBC.  Patient remains pain-free at this time.  He is not certain if GI cocktail helped him.  He will follow-up with his PCP and dermatologist.  Also suggested he may want to follow-up with a rheumatologist given his ongoing rash that has not resolved after 2 years [MP]    Clinical Course User Index [MP] Sallyanne Creamer, DO  Medical Decision Making 68 year old male with history as above presenting for 2 weeks of malaise, right upper extremity right lower extremity pain and 3 days of substernal chest pain epigastric pain.  Also reports decreased appetite.  Recently started on Dupixent  by dermatology for suspected psoriasis.  Well-appearing afebrile normotensive on my exam.  No abdominal tenderness.  There is no clear rationale to suspect Dupixent  is responsible for his presentation today.  Given reported cramping pains will look for any evidence of electrolyte imbalance or other abnormalities on CBC CMP.  Reported chest pain will evaluate with high-sensitivity troponin and EKG.  Will obtain delta troponin if initial 1 is elevated.  Will trial GI cocktail of Maalox lidocaine  to see if his symptoms may be in part due to acid reflux/GERD  Amount and/or Complexity of Data Reviewed Labs: ordered. Radiology: ordered.  Risk OTC drugs. Prescription drug management.           Final Clinical Impression(s) / ED Diagnoses Final diagnoses:  Nonspecific chest pain    Rx / DC Orders ED Discharge Orders     None         Sallyanne Creamer, DO 03/12/24 2028

## 2024-03-12 NOTE — Discharge Instructions (Signed)
 You were seen in the emerged from for chest pain Your blood work EKG and chest x-ray all looked okay Regarding your rash, you will need to follow-up with your dermatologist primary care doctor You may also want to see a rheumatologist in the area to talk about your symptoms that been ongoing for over 2 years Return to the emerged department for severe chest pain or any other concerns

## 2024-03-12 NOTE — Telephone Encounter (Signed)
 He has stopped Dupixent . He was due for an injection today but stated that he was not going to take it anymore. I did stress that he needed the EKG as soon as possible but he stated that he thinks he is fine to wait for his wife as he wants her to drive him to the Urgent Care. He did mention Prednisone  but also stated that he is aware that he can not take it long term.

## 2024-03-12 NOTE — Telephone Encounter (Signed)
 Patient called today. He took his loading dose of Dupixent  02/06/2024 and another dose on 02/27/2024. He has had numbness and tingling of his right side since his stroke but it has increased since starting Dupixent . He has also lost his appetite and had some stomach cramps and chest pains since starting. His eczema seems to be getting worse and the itching has not improved at all. Dr Myrtie Atkinson reviewed his chart and noted that he has tried and failed many steroid creams as well as tacrolimus. He states the only creams he currently has at home are Cerave Anti-itch lotion and Calcipotriene  cream. Discussed this with the patient and advised him that I was sending in the Itch-Relief Jar to [facility]. Advised him that Dr Myrtie Atkinson would like for him to go to Urgent Care of his PCP today for an EKG due to the chest pains. He will go to [facility] as soon as his wife is home from work.

## 2024-03-13 NOTE — Telephone Encounter (Signed)
 Peter Dominguez called back today to say that after we spoke yesterday, he started feeling bad so he went to the ER. He had an EKG there and it was normal and he was told that his symptoms were not likely related to Dupixent  but he should still not take it. He stated that he he had heard from [facility] and he paid the extra $10 to have the itch cream expedited. He is just very frustrated that he has had this rash for 2 1/2 years and can not seem to get any relief. Advised him to keep his follow up appointment with Dr Myrtie Atkinson 03/25/2024.

## 2024-03-25 ENCOUNTER — Encounter: Payer: Self-pay | Admitting: Dermatology

## 2024-03-25 ENCOUNTER — Ambulatory Visit (INDEPENDENT_AMBULATORY_CARE_PROVIDER_SITE_OTHER): Admitting: Dermatology

## 2024-03-25 VITALS — BP 140/104 | HR 62

## 2024-03-25 DIAGNOSIS — L299 Pruritus, unspecified: Secondary | ICD-10-CM

## 2024-03-25 DIAGNOSIS — R0789 Other chest pain: Secondary | ICD-10-CM | POA: Diagnosis not present

## 2024-03-25 DIAGNOSIS — R630 Anorexia: Secondary | ICD-10-CM

## 2024-03-25 DIAGNOSIS — L209 Atopic dermatitis, unspecified: Secondary | ICD-10-CM

## 2024-03-25 MED ORDER — MOMETASONE FUROATE 0.1 % EX OINT: TOPICAL_OINTMENT | Freq: Every day | CUTANEOUS | 6 refills | Status: DC

## 2024-03-25 NOTE — Progress Notes (Signed)
 Follow-Up Visit   Subjective  Peter Dominguez is a 68 y.o. male who presents for the following: Atopic Dermatitis  Patient present today for follow up visit for Atopic Derm. Patient was last evaluated on 03/12/24. At this visit patient was initiation Dupixent . Patient reports sxs are unchanged. Patient called into the office stating he had chest pain, abdominal pain, muscle ache, lost of appetite 7 days after his first injection at home (02/27/24). He was told by the ER that his sxs were not likely caused by Dupixent  but advised him not to inject his next dose. Patient reports hx of CAD & CVA with numbness on his Right side. He was evaluated by Cardiology on 03/20/24. His Cardiologist put him on Pravastatin at this visit. Patient denies any additional medication changes. Currently he is only applying ice pack to his back to alleviate itching.    [name], a patient with a history of eczema, presents with ongoing skin issues and new concerns about appetite loss and recent cardiac symptoms. He started Dupixent  in May and reports developing rashes on his arm after the first two injections. The rash on his arm has since resolved, but he notes dark patches remain. Two weeks ago, he experienced peeling around his arms/hands.  The patient reports a significant decrease in appetite over the past 10-12 days, stating he can only eat a couple of mouthfuls before feeling satiated. He has eliminated red meat and pork from his diet, now primarily consuming Peter Dominguez. [name] expresses concern about potential food allergies, though he denies any specific reactions after eating.  Recently, [name] visited the emergency room due to chest discomfort and pain. The ER doctors ruled out a cardiac event and suggested his symptoms might be anxiety-related. He saw his cardiologist last week, who prescribed pravastatin for cholesterol management. [name] describes his experience with eczema as "torture," stating it has  been ongoing for 2.5 years and significantly impacting his quality of life.  Regarding treatment, [name] reports using Avon LotionTherapy and SkinSoSoft for itch relief, which provides temporary relief. He applies cream to his back and arms before bed, previously using CervaVe. He continues to use prescription topical steroids, including mometasone and calcipotriene , which provide relief for a couple of hours. He tried a compounded cream with lidocaine  and gabapentin but experienced an uncomfortable sensation after the initial cooling effect wore off.  [name] discontinued Dupixent  due to concerns about side effects. He expresses frustration with the persistent nature of his eczema and the limitations of current treatments. He also mentions feeling dizzy, though this is not attributed to his dermatological treatments.  The following portions of the chart were reviewed this encounter and updated as appropriate: medications, allergies, medical history  Review of Systems:  No other skin or systemic complaints except as noted in HPI or Assessment and Plan.  Objective  Well appearing patient in no apparent distress; mood and affect are within normal limits.  A full examination was performed including scalp, head, eyes, ears, nose, lips, neck, chest, axillae, abdomen, back, buttocks, bilateral upper extremities, bilateral lower extremities, hands, feet, fingers, toes, fingernails, and toenails. All findings within normal limits unless otherwise noted below.   Relevant exam findings are noted in the Assessment and Plan.    Assessment & Plan   1. Eczema  - Assessment:  Patient has a history of eczema with recent flares on arms and legs. Discontinued Dupixent  in May due to side effects (rashes and chest discomfort). Condition has  persisted for 2.5 years, causing significant distress and anxiety. Current management includes topical treatments (mometasone, calcipotriene ) and over-the-counter  products (Avon SkinSoSoft, CeraVe anti-itch cream), providing short-lived relief.  No active skin lesions today.  Pt is not a candidate for JAK's given his cardiac hx.  Pt states that "the only thing that works is Prednisone " however he was taking this for many months despite the guidance of his other doctors and myself to not do so due to the side effects of long term use.  - Plan:    Continue mometasone cream 2-3 times daily for 2 weeks, then alternate with calcipotriene  for 2 weeks    Mix prescription creams with CeraVe anti-itch cream or [employer]on SkinSoSoft    Initiate Aveeno colloidal oatmeal baths at least 3 times per week    Patient to keep a symptom journal    Consider Benadryl before bed for nighttime itching if needed    Discontinue compounded cream with lidocaine  and gabapentin if side effects persist    Follow up in 3 months for reassessment  2. Chest Discomfort - Assessment: Recent episode led to ER visit; cardiac event ruled out, EKG normal. Cardiologist consultation performed last week without additional tests. I suspect anxiety may be contributing to symptoms.  - Plan:    Continue pravastatin as prescribed by cardiologist    Educate patient on relationship between anxiety and physical symptoms    Encourage follow-up with primary care physician for anxiety and loss of appetite evaluation  3. Loss of Appetite - Assessment: Significant loss of appetite for past 10-12 days, only able to eat a couple of mouthfuls at a time. Concerning for a more systemic issue.  - Plan:    Refer to primary care physician for evaluation and up-to-date health screenings    Consider referral to allergist for food allergy testing if deemed necessary by primary care physician ATOPIC DERMATITIS, UNSPECIFIED TYPE   Related Medications Dupilumab  (DUPIXENT ) 300 MG/2ML SOAJ Inject 300 mg into the skin every 14 (fourteen) days. Starting at day 15 for maintenance. mometasone (ELOCON) 0.1 % ointment Apply  topically daily. Apply 2-3 times daily for 2 weeks then STOP  Return in about 4 months (around 07/25/2024) for Eczema F/U.  I, Jetta Ager, am acting as Neurosurgeon for [employer], DO.  Documentation: I have reviewed the above documentation for accuracy and completeness, and I agree with the above.  Louana Roup, DO

## 2024-03-25 NOTE — Patient Instructions (Addendum)

## 2024-05-14 ENCOUNTER — Ambulatory Visit: Admitting: Dermatology

## 2024-05-22 ENCOUNTER — Other Ambulatory Visit: Payer: Self-pay

## 2024-05-22 DIAGNOSIS — L299 Pruritus, unspecified: Secondary | ICD-10-CM

## 2024-05-22 MED ORDER — SAFETY SEAL MISCELLANEOUS MISC: 1.0000 "application " | Freq: Two times a day (BID) | 4 refills | Status: DC

## 2024-08-03 ENCOUNTER — Emergency Department (HOSPITAL_BASED_OUTPATIENT_CLINIC_OR_DEPARTMENT_OTHER)

## 2024-08-03 ENCOUNTER — Encounter (HOSPITAL_BASED_OUTPATIENT_CLINIC_OR_DEPARTMENT_OTHER): Payer: Self-pay | Admitting: Emergency Medicine

## 2024-08-03 ENCOUNTER — Emergency Department (HOSPITAL_BASED_OUTPATIENT_CLINIC_OR_DEPARTMENT_OTHER)
Admission: EM | Admit: 2024-08-03 | Discharge: 2024-08-03 | Disposition: A | Discharge status: Home / Self Care | Attending: Emergency Medicine | Admitting: Emergency Medicine

## 2024-08-03 DIAGNOSIS — M79631 Pain in right forearm: Secondary | ICD-10-CM | POA: Diagnosis not present

## 2024-08-03 DIAGNOSIS — R079 Chest pain, unspecified: Secondary | ICD-10-CM | POA: Diagnosis present

## 2024-08-03 DIAGNOSIS — I2510 Atherosclerotic heart disease of native coronary artery without angina pectoris: Secondary | ICD-10-CM | POA: Insufficient documentation

## 2024-08-03 DIAGNOSIS — Z7982 Long term (current) use of aspirin: Secondary | ICD-10-CM | POA: Insufficient documentation

## 2024-08-03 LAB — CBC WITH DIFFERENTIAL/PLATELET
Abs Immature Granulocytes: 0.02 K/uL (ref 0.00–0.07)
Basophils Absolute: 0 K/uL (ref 0.0–0.1)
Basophils Relative: 1 %
Eosinophils Absolute: 0.7 K/uL — ABNORMAL HIGH (ref 0.0–0.5)
Eosinophils Relative: 11 %
HCT: 34.1 % — ABNORMAL LOW (ref 39.0–52.0)
Hemoglobin: 11.4 g/dL — ABNORMAL LOW (ref 13.0–17.0)
Immature Granulocytes: 0 %
Lymphocytes Relative: 12 %
Lymphs Abs: 0.8 K/uL (ref 0.7–4.0)
MCH: 28.9 pg (ref 26.0–34.0)
MCHC: 33.4 g/dL (ref 30.0–36.0)
MCV: 86.3 fL (ref 80.0–100.0)
Monocytes Absolute: 1.1 K/uL — ABNORMAL HIGH (ref 0.1–1.0)
Monocytes Relative: 17 %
Neutro Abs: 4.1 K/uL (ref 1.7–7.7)
Neutrophils Relative %: 59 %
Platelets: 467 K/uL — ABNORMAL HIGH (ref 150–400)
RBC: 3.95 MIL/uL — ABNORMAL LOW (ref 4.22–5.81)
RDW: 15.6 % — ABNORMAL HIGH (ref 11.5–15.5)
WBC: 6.9 K/uL (ref 4.0–10.5)
nRBC: 0 % (ref 0.0–0.2)

## 2024-08-03 LAB — BASIC METABOLIC PANEL WITH GFR
Anion gap: 15 (ref 5–15)
BUN: 12 mg/dL (ref 8–23)
CO2: 23 mmol/L (ref 22–32)
Calcium: 9.9 mg/dL (ref 8.9–10.3)
Chloride: 98 mmol/L (ref 98–111)
Creatinine, Ser: 0.77 mg/dL (ref 0.61–1.24)
GFR, Estimated: >60 mL/min (ref >60)
Glucose, Bld: 113 mg/dL — ABNORMAL HIGH (ref 70–99)
Potassium: 4.1 mmol/L (ref 3.5–5.1)
Sodium: 135 mmol/L (ref 135–145)

## 2024-08-03 LAB — TROPONIN T, HIGH SENSITIVITY: Troponin T High Sensitivity: <15 ng/L (ref 0–19)

## 2024-08-03 NOTE — ED Provider Notes (Signed)
 [facility] [facility] Provider Note   CSN: [id number] Arrival date & time: 08/03/24  1542     Patient presents with: Back Pain and Chest Pain   Peter Dominguez is a 68 y.o. male with a history of single-vessel coronary disease status post DES stenting 2022, presenting to the ED with several concerns.  Patient reports he has had intermittent sharp pains across his chest, left and right sided, and also occasionally around his neck.  He has pain in his right forearm, worse with hyperflexion of the forearm, and also pain radiating down his left buttock to his left knee.  He says he does have sciatica.  His right sided paresthesias are residual from a prior stroke.  He denies any persistent chest pain or difficulty breathing.   HPI     Prior to Admission medications   Medication Sig Start Date End Date Taking? Authorizing Provider  aspirin  EC 81 MG EC tablet Take 1 tablet (81 mg total) by mouth daily. 05/10/19   Briana Elgin LABOR, MD  atorvastatin  (LIPITOR ) 80 MG tablet Take 1 tablet (80 mg total) by mouth daily. Patient not taking: Reported on 03/25/2024 07/27/21   Zhao, Xika, NP  calcipotriene  (DOVONOX) 0.005 % cream Apply topically 2 (two) times daily. Apply to affected areas twice daily 02/05/24   Alm Delon SAILOR, DO  carvedilol  (COREG ) 3.125 MG tablet Take 1 tablet (3.125 mg total) by mouth 2 (two) times daily with a meal. 07/26/21   Zhao, Xika, NP  clotrimazole-betamethasone (LOTRISONE) cream Apply 1 application topically 2 (two) times daily as needed for rash. 03/25/21   [provider]  divalproex  (DEPAKOTE  ER) 500 MG 24 hr tablet Take 1,000 mg by mouth daily. 05/25/21   [provider]  Dupilumab  (DUPIXENT ) 300 MG/2ML SOAJ Inject 300 mg into the skin every 14 (fourteen) days. Starting at day 15 for maintenance. Patient not taking: Reported on 03/25/2024 02/13/24   Alm Delon SAILOR, DO  Lacosamide  100 MG TABS Take 0.5 tablets by mouth 2  (two) times daily. 05/27/21   [provider]  mometasone  (ELOCON ) 0.1 % ointment Apply topically daily. Apply 2-3 times daily for 2 weeks then STOP 03/25/24   Alm Delon SAILOR, DO  nitroGLYCERIN  (NITROSTAT ) 0.4 MG SL tablet PLACE 1 TABLET UNDER THE TONGUE EVERY 5 MINUTES X 3 DOSES AS NEEDED FOR CHEST PAIN. 02/19/23   Goodrich, Callie E, PA-C  Roflumilast  (ZORYVE ) 0.3 % CREA Apply to affected areas once a day. Patient not taking: Reported on 03/25/2024 01/30/24   Alm Delon SAILOR, DO  Safety Seal Miscellaneous MISC Apply 1 application  topically 2 (two) times daily. Medication Name - Chet Marion 05/22/24   Alm Delon SAILOR, DO  ticagrelor  (BRILINTA ) 90 MG TABS tablet Take 1 tablet (90 mg total) by mouth 2 (two) times daily. 07/26/21   Zhao, Xika, NP  ticagrelor  (BRILINTA ) 90 MG TABS tablet TAKE 1 TABLET BY MOUTH TWICE A DAY 10/19/22   Anner Alm ORN, MD    Allergies: Tramadol, Bupropion, and Rosuvastatin     Review of Systems  Updated Vital Signs BP 101/68 (BP Location: Right Arm)   Pulse 94   Temp 97.9 F (36.6 C)   Resp 20   Ht 5' 10 (1.778 m)   Wt 74.8 kg   SpO2 93%   BMI 23.68 kg/m   Physical Exam Constitutional:      General: He is not in acute distress. HENT:     Head: Normocephalic and  atraumatic.  Eyes:     Conjunctiva/sclera: Conjunctivae normal.     Pupils: Pupils are equal, round, and reactive to light.  Cardiovascular:     Rate and Rhythm: Normal rate and regular rhythm.  Pulmonary:     Effort: Pulmonary effort is normal. No respiratory distress.  Abdominal:     General: There is no distension.     Tenderness: There is no abdominal tenderness.  Musculoskeletal:     Comments: Tenderness of the distal bicep and pain with pronation of the right forearm; no warmth and erythema of the right elbow, full range of motion of the extremities; no spinal midline tenderness or paraspinal tenderness  Skin:    General: Skin is warm and dry.  Neurological:      General: No focal deficit present.     Mental Status: He is alert. Mental status is at baseline.  Psychiatric:        Mood and Affect: Mood normal.        Behavior: Behavior normal.     (all labs ordered are listed, but only abnormal results are displayed) Labs Reviewed  CBC WITH DIFFERENTIAL/PLATELET - Abnormal; Notable for the following components:      Result Value   RBC 3.95 (*)    Hemoglobin 11.4 (*)    HCT 34.1 (*)    RDW 15.6 (*)    Platelets 467 (*)    Monocytes Absolute 1.1 (*)    Eosinophils Absolute 0.7 (*)    All other components within normal limits  BASIC METABOLIC PANEL WITH GFR - Abnormal; Notable for the following components:   Glucose, Bld 113 (*)    All other components within normal limits  TROPONIN T, HIGH SENSITIVITY    EKG: EKG Interpretation Date/Time:  Sunday August 03 2024 16:14:36 EDT Ventricular Rate:  95 PR Interval:  133 QRS Duration:  82 QT Interval:  334 QTC Calculation: 420 R Axis:   74  Text Interpretation: Sinus rhythm Borderline T abnormalities, diffuse leads  No significant changes from prior tracing Confirmed by Cottie Cough [phone]) on 08/03/2024 4:42:26 PM  Radiology: [facility] Chest 2 View Result Date: 08/03/2024 CLINICAL DATA:  Chest pain and shortness of breath for several days. EXAM: CHEST - 2 VIEW COMPARISON:  03/04/2024 FINDINGS: Heart size remains within normal limits. Bilateral mediastinal lymphadenopathy is again seen, without significant change. No evidence of pulmonary consolidation or edema. Tiny left pleural effusion versus pleural thickening noted. IMPRESSION: No active lung disease. Tiny left pleural effusion versus thickening. Stable bilateral mediastinal lymphadenopathy. Electronically Signed   By: Norleen DELENA Kil M.D.   On: 08/03/2024 17:12   DG Shoulder Right Result Date: 08/03/2024 CLINICAL DATA:  Right shoulder pain and decreased range of motion. EXAM: RIGHT SHOULDER - 2+ VIEW COMPARISON:  None Available. FINDINGS:  There is no evidence of fracture or dislocation. Mild degenerative spurring of the [facility] joint noted. No other focal bone abnormality. Soft tissues are unremarkable. IMPRESSION: No acute findings. Mild acromioclavicular DJD. Electronically Signed   By: Norleen DELENA Kil M.D.   On: 08/03/2024 17:08     Procedures   Medications Ordered in the ED - No data to display                                  Medical Decision Making  This patient presents to the ED with concern for intermittent chest pains, arm pains, paresthesias, leg pain. This involves an extensive  number of treatment options, and is a complaint that carries with it a high risk of complications and morbidity.   Differential is broad for the chest pain can include musculoskeletal pain versus chest wall pain versus ACS versus pneumonia versus pneumothorax versus other  Lower suspicion for acute PE or aortic dissection given the intermittent and migrating nature of the symptoms.  I do not think he needs an emergent CT angiogram.  Co-morbidities that complicate the patient evaluation: History of known coronary disease and cardiovascular risk factors  External records from outside source obtained and reviewed including left heart catheterization report from 2022  I ordered and personally interpreted labs.  The pertinent results include: No emergent findings.  I ordered imaging studies including x-ray of the chest I independently visualized and interpreted imaging which showed small pleural effusion and stable medial lymphadenopathy I agree with the radiologist interpretation  The patient was maintained on a cardiac monitor.  I personally viewed and interpreted the cardiac monitored which showed an underlying rhythm of: Sinus rhythm  Per my interpretation the patient's ECG shows no acute ischemic findings  I have reviewed the patients home medicines and have made adjustments as needed  I have very low suspicion for ACS.  I did recommend  that he reach out to his primary care provider to discuss CT imaging of the chest, given his report of dry persistent coughing, mediastinal lymphadenopathy noted on x-ray, and concern for potential malignancy in 2007 from MRI of the thoracic spine.  I think CT imaging for malignancy would be reasonable and encouraged him to contact his doctor for this.  Dispostion:  After consideration of the diagnostic results and the patients response to treatment, I feel that the patient would benefit from close outpatient follow-up      Final diagnoses:  Chest pain, unspecified type  Pain of right forearm    ED Discharge Orders     None          Genessa Beman, Donnice PARAS, MD 08/03/24 [phone]

## 2024-08-03 NOTE — Discharge Instructions (Addendum)
 Your workup and testings were reassuring in the ER today.  However, I did recommend that you contact your primary care provider's office to schedule follow-up appointment.  I would recommend you talk to your doctor about ordering a CT scan of your chest and lungs.  You have potential enlarged lymph nodes that may be causing your pain, but may need further workup.  These can be a sign of infection, or in some cases cancer, and so it is important that you have this imaging done.  Your right forearm pain is likely related to tendinitis, inflammation of your muscle tendon.  I recommend he try to rested for the next 2 weeks.  Try to avoid repetitive movements that require use during your forearm.

## 2024-08-03 NOTE — ED Provider Triage Note (Signed)
 Emergency Medicine Provider Triage Evaluation Note  Peter Dominguez , a 68 y.o. male  was evaluated in triage.  Pt complains of back and shoulder pain, shortness of breath, chest pain.  Patient reports the symptoms have been going on for several days.  Patient has associated tingling in his right arm has been going on for several weeks.  Patient reports some decreased range of motion in his right shoulder  Review of Systems  Positive: Chest pain, shortness of breath, right shoulder pain, Negative: Weakness, dizziness, syncope, fevers, chills, rash  Physical Exam  BP 101/68 (BP Location: Right Arm)   Pulse 94   Temp 97.9 F (36.6 C)   Resp 20   Ht 5' 10 (1.778 m)   Wt 74.8 kg   SpO2 93%   BMI 23.68 kg/m  Gen:   Awake, no distress   Resp:  Normal effort  MSK:   Moves extremities with pain to range of motion of the right shoulder no other abnormalities Other:    Medical Decision Making  Medically screening exam initiated at 4:15 PM.  Appropriate orders placed.  Peter Dominguez was informed that the remainder of the evaluation will be completed by another provider, this initial triage assessment does not replace that evaluation, and the importance of remaining in the ED until their evaluation is complete.     Myriam Fonda RAMAN, [place] 08/03/24 [phone]

## 2024-08-03 NOTE — ED Triage Notes (Signed)
 Pt c/o upper back pain with radiation to RUE and has pain down RT hip and RLE; also reports CP (pressure), coughing up mucus

## 2024-08-13 ENCOUNTER — Ambulatory Visit: Admitting: Dermatology

## 2024-09-16 ENCOUNTER — Telehealth: Payer: Self-pay

## 2024-09-16 DIAGNOSIS — L209 Atopic dermatitis, unspecified: Secondary | ICD-10-CM

## 2024-09-16 MED ORDER — HALOBETASOL PROPIONATE 0.05 % EX CREA: TOPICAL_CREAM | Freq: Two times a day (BID) | CUTANEOUS | 2 refills | Status: AC

## 2024-09-16 NOTE — Telephone Encounter (Signed)
 Per verbal conversation DO is not comfortable with prescribing oral prednisone  at this time. We can change your topical steroid to something stronger, halobetasol, to alternate with the calcipotriene . However, it is injectable medications like Nemluvio that can be discussed at the next office visit for treatment options.    Pt informed and expressed understanding. He would like to proceed with changing topical steroid to halobetasol. Rx sent with sig apply to affected areas Bid for 2 weeks on, 2 weeks off - alternate with calcipotriene .

## 2024-09-16 NOTE — Telephone Encounter (Signed)
 Pt left a voicemail requesting a refill of prednisone  as he waits for his next OV on 01/12/25. He was last seen on 03/25/24 with a requested 79mo follow up. Per last OV note his treatment plan is as follows:  - Plan:    Continue mometasone  cream 2-3 times daily for 2 weeks, then alternate with calcipotriene  for 2 weeks    Mix prescription creams with CeraVe anti-itch cream or [employer] SkinSoSoft    Initiate Aveeno colloidal oatmeal baths at least 3 times per week    Patient to keep a symptom journal    Consider Benadryl before bed for nighttime itching if needed    Discontinue compounded cream with lidocaine  and gabapentin if side effects persist    Follow up in 3 months for reassessment  I have spoke with the patient. He stated that he only alternated topical mometasone  and calcipotriene  every 2 weeks for a month and a half after his last OV. He D/C as he stated they dont work. I inquired if he incorporated the CeraVe Anti Itch cream or Aveeno oatmeal baths. He stated he did not purchase the CeraVe but he did use the Aveeno oatmeal for about 2 mo. He stated for 2 mo it worked well then stopped helping with the itching. He is currently still taking Benadryl nightly but he said it only helps him sleep but nothing more.   I informed patient that I would discuss with DO and call back with further instructions or recommendations.

## 2024-09-16 NOTE — Addendum Note (Signed)
 Addended by: Adriann Thau U on: 09/16/2024 04:51 PM   Modules accepted: Orders

## 2024-10-07 ENCOUNTER — Other Ambulatory Visit: Payer: Self-pay

## 2024-10-07 DIAGNOSIS — L299 Pruritus, unspecified: Secondary | ICD-10-CM

## 2024-10-07 MED ORDER — SAFETY SEAL MISCELLANEOUS MISC: 1.0000 "application " | Freq: Two times a day (BID) | 4 refills | Status: AC

## 2024-10-07 NOTE — Progress Notes (Signed)
 Pt has called in requesting refill of Medrock Itch relief - refill has been sent.  Pt also mentioned that the halobetasol  has not been helping. He stated that when he researched he found that it caused redness, itching and inflammation. Stating that those are all the problems that he has already been facing and would like to discuss other options.   Pt has been informed that provider is out of the office until 10/20/24 and expressed understanding.   Please advise if you would like pt to be scheduled upon your return to discuss alternative regimens.

## 2025-01-12 ENCOUNTER — Ambulatory Visit: Admitting: Dermatology
